# Patient Record
Sex: Male | Born: 1935 | Race: White | Hispanic: No | Marital: Married | Smoking: Never smoker
Health system: Southern US, Community
[De-identification: ages and names within clinical notes are randomized; demographics above are authoritative.]

## PROBLEM LIST (undated history)

## (undated) DIAGNOSIS — F0390 Unspecified dementia without behavioral disturbance: Secondary | ICD-10-CM

## (undated) DIAGNOSIS — I10 Essential (primary) hypertension: Secondary | ICD-10-CM

## (undated) DIAGNOSIS — H9190 Unspecified hearing loss, unspecified ear: Secondary | ICD-10-CM

## (undated) DIAGNOSIS — R569 Unspecified convulsions: Secondary | ICD-10-CM

## (undated) DIAGNOSIS — I495 Sick sinus syndrome: Secondary | ICD-10-CM

## (undated) DIAGNOSIS — I639 Cerebral infarction, unspecified: Secondary | ICD-10-CM

## (undated) DIAGNOSIS — IMO0001 Reserved for inherently not codable concepts without codable children: Secondary | ICD-10-CM

## (undated) DIAGNOSIS — D649 Anemia, unspecified: Secondary | ICD-10-CM

## (undated) DIAGNOSIS — E119 Type 2 diabetes mellitus without complications: Secondary | ICD-10-CM

## (undated) DIAGNOSIS — E785 Hyperlipidemia, unspecified: Secondary | ICD-10-CM

## (undated) HISTORY — DX: Anemia, unspecified: D64.9

## (undated) HISTORY — PX: CARDIAC CATHETERIZATION: SHX172

## (undated) HISTORY — PX: CHOLECYSTECTOMY: SHX55

## (undated) HISTORY — PX: BACK SURGERY: SHX140

## (undated) HISTORY — DX: Type 2 diabetes mellitus without complications: E11.9

## (undated) HISTORY — DX: Unspecified convulsions: R56.9

## (undated) HISTORY — DX: Unspecified dementia, unspecified severity, without behavioral disturbance, psychotic disturbance, mood disturbance, and anxiety: F03.90

## (undated) HISTORY — DX: Hyperlipidemia, unspecified: E78.5

## (undated) HISTORY — PX: ROTATOR CUFF REPAIR: SHX139

## (undated) HISTORY — DX: Essential (primary) hypertension: I10

## (undated) HISTORY — DX: Sick sinus syndrome: I49.5

## (undated) HISTORY — PX: CORONARY ARTERY BYPASS GRAFT: SHX141

## (undated) HISTORY — PX: CATARACT EXTRACTION: SUR2

## (undated) HISTORY — PX: COLONOSCOPY: SHX174

---

## 1997-05-14 ENCOUNTER — Inpatient Hospital Stay (HOSPITAL_COMMUNITY): Admission: RE | Admit: 1997-05-14 | Discharge: 1997-05-17 | Payer: Self-pay | Admitting: Cardiology

## 1999-05-22 ENCOUNTER — Inpatient Hospital Stay (HOSPITAL_COMMUNITY): Admission: RE | Admit: 1999-05-22 | Discharge: 1999-05-30 | Payer: Self-pay | Admitting: Family Medicine

## 1999-05-23 ENCOUNTER — Encounter: Payer: Self-pay | Admitting: Cardiothoracic Surgery

## 1999-05-25 ENCOUNTER — Encounter: Payer: Self-pay | Admitting: Cardiothoracic Surgery

## 1999-05-26 ENCOUNTER — Encounter: Payer: Self-pay | Admitting: Cardiothoracic Surgery

## 1999-05-27 ENCOUNTER — Encounter: Payer: Self-pay | Admitting: Cardiothoracic Surgery

## 1999-05-28 ENCOUNTER — Encounter: Payer: Self-pay | Admitting: Cardiothoracic Surgery

## 2012-10-01 DIAGNOSIS — D62 Acute posthemorrhagic anemia: Secondary | ICD-10-CM | POA: Insufficient documentation

## 2012-10-01 DIAGNOSIS — S36892A Contusion of other intra-abdominal organs, initial encounter: Secondary | ICD-10-CM | POA: Insufficient documentation

## 2012-10-02 DIAGNOSIS — IMO0002 Reserved for concepts with insufficient information to code with codable children: Secondary | ICD-10-CM | POA: Insufficient documentation

## 2012-10-02 DIAGNOSIS — R739 Hyperglycemia, unspecified: Secondary | ICD-10-CM | POA: Insufficient documentation

## 2014-06-25 ENCOUNTER — Other Ambulatory Visit: Payer: Self-pay

## 2014-06-25 ENCOUNTER — Ambulatory Visit (INDEPENDENT_AMBULATORY_CARE_PROVIDER_SITE_OTHER): Payer: Medicare Other | Admitting: Vascular Surgery

## 2014-06-25 ENCOUNTER — Encounter: Payer: Self-pay | Admitting: Vascular Surgery

## 2014-06-25 ENCOUNTER — Encounter (INDEPENDENT_AMBULATORY_CARE_PROVIDER_SITE_OTHER): Payer: Self-pay

## 2014-06-25 VITALS — BP 191/68 | HR 65 | Ht 73.0 in | Wt 188.5 lb

## 2014-06-25 DIAGNOSIS — I6521 Occlusion and stenosis of right carotid artery: Secondary | ICD-10-CM

## 2014-06-25 NOTE — Progress Notes (Signed)
Subjective:     Patient ID: Gary Kirby, male   DOB: 12-05-1933, 81 y.o.   MRN: [id number]  HPI this 81 year old male was referred by Dr. [name] for evaluation of severe right carotid stenosis. Patient has a remote history of CVA in 2014. He has not had recent symptoms other than occasional changes in the visual field of his right eye which are nonspecific. He has previously had ultrasounds [facility]. He recently had a CT angiogram which revealed a severe right ICA stenosis and a mild to moderate left ICA stenosis and he was referred for further evaluation. He does have a history of coronary artery bypass grafting in 2001 by Dr. Kathlee NationsPeter Van Trigt. He was seen recently by his cardiologist Dr.Revankar who cleared the patient for lumbar spine surgery. The CT angiogram was then performed which revealed the severe carotid stenosis and he was referred here for further evaluation.  Past Medical History  Diagnosis Date  . Anemia   . Diabetes mellitus without complication   . Hypertension   . Hyperlipidemia     History  Substance Use Topics  . Smoking status: Never Smoker   . Smokeless tobacco: Current User    Types: Snuff  . Alcohol Use: No    Family History  Problem Relation Age of Onset  . Heart disease Mother   . Heart attack Father   . Cancer Brother   . Diabetes Brother   . Heart disease Brother   . Hyperlipidemia Brother     No Known Allergies   Current outpatient prescriptions:  .  Acetaminophen (ARTHRITIS PAIN RELIEVER PO), Take 360 mg by mouth as needed., Disp: , Rfl:  .  amLODipine (NORVASC) 5 MG tablet, Take 5 mg by mouth daily., Disp: , Rfl: 12 .  aspirin 325 MG tablet, Take 325 mg by mouth daily., Disp: , Rfl:  .  citalopram (CELEXA) 20 MG tablet, Take 1 tablet by mouth daily., Disp: , Rfl:  .  hydrochlorothiazide (HYDRODIURIL) 12.5 MG tablet, Take 12.5 mg by mouth daily., Disp: , Rfl:  .  isosorbide mononitrate (IMDUR) 60 MG 24 hr tablet, Take 60 mg by  mouth daily., Disp: , Rfl: 3 .  meclizine (ANTIVERT) 25 MG tablet, Take 25 mg by mouth 3 (three) times daily as needed. , Disp: , Rfl: 3 .  metFORMIN (GLUCOPHAGE) 1000 MG tablet, Take 1 tablet by mouth 2 (two) times daily., Disp: , Rfl:  .  pramipexole (MIRAPEX) 0.5 MG tablet, Take 0.5 mg by mouth daily as needed. , Disp: , Rfl: 12 .  quinapril (ACCUPRIL) 40 MG tablet, Take 40 mg by mouth daily., Disp: , Rfl:   Filed Vitals:   06/25/14 0836 06/25/14 0837  BP: 196/66 191/68  Pulse: 65   Height: 6\' 1"  (1.854 m)   Weight: 188 lb 8 oz (85.503 kg)   SpO2: 97%     Body mass index is 24.87 kg/(m^2).         tReview of Systems denies any active chest pain or dyspnea on exertion.   has occasional pain in legs with walking and while lying flat. Occasionally complains of numbness in the arms or legs and dizziness. Had lumbar spine problems and was tentatively scheduled for surgery which now apparently has improved. Other systems negative and complete review of systems Objective:   Physical Exam BP 191/68 mmHg  Pulse 65  Ht 6\' 1"  (1.854 m)  Wt 188 lb 8 oz (85.503 kg)  BMI 24.87 kg/m2  SpO2 97%  Gen.-alert and oriented x3 in no apparent distress HEENT normal for age Lungs no rhonchi or wheezing Cardiovascular regular rhythm no murmurs carotid pulses 3+ palpable--soft bruits bilaterally over carotid bifurcations Abdomen soft nontender no palpable masses Musculoskeletal free of  major deformities Skin clear -no rashes Neurologic normal Lower extremities 3+ femoral and 2+ popliteal pulses palpable bilaterally. Right leg with 2+ PT left leg with 1+ PT pulse palpable  Today I reviewed the records supplied by Dr. [name]. Also reviewed the report of the CT angiogram of the neck which revealed a greater than 80% right ICA stenosis and mild left ICA stenosis. I also reviewed the images of the CT angiogram by computer. It appears that the right ICA stenosis approximates 90% and the left ICA  stenosis is mild. Patient also has evidence of note right occipital lobe infarct and laminar necrosis with no evidence of hemorrhage.  I also had a phone discussion with Dr. [name] regarding this patient and his general medical condition and also consulted DR Revankar regarding his cardiac status. His most recent evaluation revealed a normal ejection fraction with no evidence of ischemia.      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 Risks and benefits thoroughly discussed with patient and he would like to proceed  45 minutes time spent in perioperative counseling of patient  and coordination of care and discussion of situation with referring doctors Dr. Samuel GermanyGage and Dr.Revankar

## 2014-06-27 ENCOUNTER — Encounter (HOSPITAL_COMMUNITY): Payer: Self-pay

## 2014-06-27 ENCOUNTER — Encounter (HOSPITAL_COMMUNITY)
Admission: RE | Admit: 2014-06-27 | Discharge: 2014-06-27 | Disposition: A | Discharge status: Home / Self Care | Payer: Medicare Other | Source: Ambulatory Visit | Attending: Vascular Surgery | Admitting: Vascular Surgery

## 2014-06-27 HISTORY — DX: Cerebral infarction, unspecified: I63.9

## 2014-06-27 HISTORY — DX: Reserved for inherently not codable concepts without codable children: IMO0001

## 2014-06-27 HISTORY — DX: Unspecified hearing loss, unspecified ear: H91.90

## 2014-06-27 LAB — PROTIME-INR
INR: 1.04 (ref 0.00–1.49)
Prothrombin Time: 13.8 seconds (ref 11.6–15.2)

## 2014-06-27 LAB — URINALYSIS, ROUTINE W REFLEX MICROSCOPIC
Bilirubin Urine: NEGATIVE
Glucose, UA: >1000 mg/dL — AB
Hgb urine dipstick: NEGATIVE
KETONES UR: NEGATIVE mg/dL
LEUKOCYTES UA: NEGATIVE
NITRITE: NEGATIVE
PROTEIN: NEGATIVE mg/dL
Specific Gravity, Urine: 1.018 (ref 1.005–1.030)
Urobilinogen, UA: 0.2 mg/dL (ref 0.0–1.0)
pH: 5 (ref 5.0–8.0)

## 2014-06-27 LAB — CBC
HEMATOCRIT: 34.7 % — AB (ref 39.0–52.0)
HEMOGLOBIN: 11.1 g/dL — AB (ref 13.0–17.0)
MCH: 30.7 pg (ref 26.0–34.0)
MCHC: 32 g/dL (ref 30.0–36.0)
MCV: 96.1 fL (ref 78.0–100.0)
Platelets: 206 10*3/uL (ref 150–400)
RBC: 3.61 MIL/uL — ABNORMAL LOW (ref 4.22–5.81)
RDW: 13.9 % (ref 11.5–15.5)
WBC: 8.8 10*3/uL (ref 4.0–10.5)

## 2014-06-27 LAB — COMPREHENSIVE METABOLIC PANEL
ALT: 12 U/L — ABNORMAL LOW (ref 17–63)
AST: 18 U/L (ref 15–41)
Albumin: 3.6 g/dL (ref 3.5–5.0)
Alkaline Phosphatase: 48 U/L (ref 38–126)
Anion gap: 9 (ref 5–15)
BUN: 25 mg/dL — ABNORMAL HIGH (ref 6–20)
CHLORIDE: 103 mmol/L (ref 101–111)
CO2: 26 mmol/L (ref 22–32)
Calcium: 9.2 mg/dL (ref 8.9–10.3)
Creatinine, Ser: 1.21 mg/dL (ref 0.61–1.24)
GFR calc non Af Amer: 56 mL/min — ABNORMAL LOW (ref >60)
Glucose, Bld: 228 mg/dL — ABNORMAL HIGH (ref 65–99)
Potassium: 4.5 mmol/L (ref 3.5–5.1)
SODIUM: 138 mmol/L (ref 135–145)
TOTAL PROTEIN: 6.4 g/dL — AB (ref 6.5–8.1)
Total Bilirubin: 0.4 mg/dL (ref 0.3–1.2)

## 2014-06-27 LAB — SURGICAL PCR SCREEN
MRSA, PCR: NEGATIVE
STAPHYLOCOCCUS AUREUS: NEGATIVE

## 2014-06-27 LAB — PREPARE RBC (CROSSMATCH)

## 2014-06-27 LAB — URINE MICROSCOPIC-ADD ON

## 2014-06-27 LAB — GLUCOSE, CAPILLARY: Glucose-Capillary: 232 mg/dL — ABNORMAL HIGH (ref 65–99)

## 2014-06-27 LAB — ABO/RH: ABO/RH(D): O POS

## 2014-06-27 LAB — APTT: APTT: 34 s (ref 24–37)

## 2014-06-27 NOTE — Pre-Procedure Instructions (Signed)
Gary SavoyBilly J Kirby  06/27/2014   Your procedure is scheduled on:  Friday Jun 28, 2014 at 7:30 AM.  Report to [facility] at 5:30 AM.  Call this number if you have problems the morning of surgery: [phone]   Remember:   Do not eat food or drink liquids after midnight.   Take these medicines the morning of surgery with A SIP OF WATER: Acetaminophen (Tylenol) if needed, Amlodipine (Norvasc), Citalopram (Celexa), Hydrocodone if needed, Isosorbide (Imdur), Meclizine (Antivert) if needed   Do NOT take any diabetic medications the morning of your surgery (Ex. Metformin/Glocophage or Januvia)   Do not wear jewelry.  Do not wear lotions, powders, or cologne.   Men may shave face and neck.  Do not bring valuables to the hospital.  [facility] is not responsible for any belongings or valuables.               Contacts, dentures or bridgework may not be worn into surgery.  Leave suitcase in the car. After surgery it may be brought to your room.  For patients admitted to the hospital, discharge time is determined by your treatment team.               Patients discharged the day of surgery will not be allowed to drive home.  Name and phone number of your driver:   Special Instructions: Shower using CHG soap the night before and the morning of your surgery   Please read over the following fact sheets that you were given: Pain Booklet, Coughing and Deep Breathing, Blood Transfusion Information, MRSA Information and Surgical Site Infection Prevention

## 2014-06-27 NOTE — Progress Notes (Signed)
Anesthesia Chart Review:  Patient is a 81 year old male scheduled for right CEA on 06/28/14 by Dr. Hart RochesterLawson. PAT was 06/27/14.  History includes non-smoker, CAD s/p CABG '01, DM2, CVA '14, exertional dyspnea, HTN, anemia, CVA, cholecytectomy.  PCP is Dr. [name].  Cardiologist is with [facility] [place].  Dr. Hart RochesterLawson has discussed plans for surgery with Dr. Samuel GermanyGage and Dr. Josiah Loboevenkar. There is a note of cardiac clearance scanned under the Media tab.   Meds include amlodipine, ASA, Celexa, Folvite, HCTZ, Norco, Imdur, Januvia, metformin, Mirapex, pravastatin, quinapril. He is not on b-blocker therapy because of relative bradycardia.  02/20/14 EKG: NSR, RBBB.  Cardiology notes indicate patient had a recent non-ischemic stress test.  Dr. Crisoforo Oxfordevenkar's office said stress test results would have to be obtained from central [facility] MR department since they were no longer associated with [facility].  Report from 03/14/14 received and showed: functional capacity is only fair for age - 4 METS; small hyperdynamic LV, measured EF 80%; mild diaphragm attenuation artifact.; no clinical or scintigraphic ischemia at target HR. NEGATIVE TREADMILL MYOCARDIAL PERFUSION STRESS TEST.   03/08/08 Cardiac cath (HPR): Patent LIMA to the mid LAD. Patent SVG to the mid CX. Mild disease of SVG to the mid DIAG1. Hyperdynamic LV systolic function, EF 85%. LM diffuse 80%, LAD 80% ostial, LCX ok, RCA proximal 40%. Medical therapy for CAD risk factor reduction.   According to Dr. Candie ChromanLawson's note, recentt imaging showed: "CT angiogram of the neck which revealed a greater than 80% right ICA stenosis and mild left ICA stenosis. I also reviewed the images of the CT angiogram by computer. It appears that the right ICA stenosis approximates 90% and the left ICA stenosis is mild. Patient also has evidence of note right occipital lobe infarct and laminar necrosis with no evidence of hemorrhage".  Preoperative labs  noted.  Patient has cardiac clearance. I think he will be able to proceed as planned if no acute changes.   Velna Ochsllison Mariateresa Batra, PA-C [facility]iology Phone [phone] 06/27/2014 5:38 PM

## 2014-06-27 NOTE — Progress Notes (Signed)
Patient arrived to scheduled 1200 PAT at 1050. Patients CBG was 232. Patient stated he ate one and a half bowls of cereal and used Equal sugar. When asked about fasting glucose patient informed Nurse that he does not check his blood sugars in the morning, only at night. Patient stated the highest he has seen his blood sugar was 230 and the lowest was 100. Nurse questioned patient about last hemoglobin A1C and patient stated "what is that? I don't know what that is?" Nurse then asked patient who managed his diabetes and he stated "I do!." Then Nurse reworded the question and asked him which physician managed his diabetes and he stated [name].   Nurse instructed patient to check his blood sugar DOS and IF it was below 70 to drink a half a cup of apple or cranberry juice as instructed on diabetes handout. Patient verbalized understanding.

## 2014-06-27 NOTE — Progress Notes (Signed)
PCP is Renae FickleJohn Kirby. Patient stated he had a Cardiologist in [place] but was unsure of who it is.Marland Kitchen.Marland Kitchen.Marland Kitchen.Marland Kitchen.patient stated "he has a funny name." Cardiologist may be Gypsy Balsamobert Krasowski. Patient also informed Nurse that he had a stress test within the past few weeks. Will request results. Patient denied having any acute cardiac or pulmonary issues. Nurse questioned patient as to why he needed to have a stress test and patient stated "I don't know.....sometimes I feel funny.Marland Kitchen.Marland Kitchen.Marland Kitchen.I can't explain it other than I feel funny, but the doctor told me that everything was alright." Patient denied having a cardiac cath but apparently patient had one before having a CABG. Nurse called Revonda StandardAllison, PA and informed her patients cardiac history.

## 2014-06-28 ENCOUNTER — Encounter (HOSPITAL_COMMUNITY)
Admission: RE | Disposition: A | Discharge status: Home / Self Care | Payer: Self-pay | Source: Ambulatory Visit | Attending: Vascular Surgery

## 2014-06-28 ENCOUNTER — Inpatient Hospital Stay (HOSPITAL_COMMUNITY): Payer: Medicare Other | Admitting: Vascular Surgery

## 2014-06-28 ENCOUNTER — Inpatient Hospital Stay (HOSPITAL_COMMUNITY)
Admission: RE | Admit: 2014-06-28 | Discharge: 2014-06-29 | DRG: 039 | Disposition: A | Discharge status: Home / Self Care | Payer: Medicare Other | Source: Ambulatory Visit | Attending: Vascular Surgery | Admitting: Vascular Surgery

## 2014-06-28 ENCOUNTER — Encounter (HOSPITAL_COMMUNITY): Payer: Self-pay | Admitting: Anesthesiology

## 2014-06-28 ENCOUNTER — Inpatient Hospital Stay (HOSPITAL_COMMUNITY): Payer: Medicare Other | Admitting: Anesthesiology

## 2014-06-28 DIAGNOSIS — E119 Type 2 diabetes mellitus without complications: Secondary | ICD-10-CM | POA: Diagnosis present

## 2014-06-28 DIAGNOSIS — I10 Essential (primary) hypertension: Secondary | ICD-10-CM | POA: Diagnosis present

## 2014-06-28 DIAGNOSIS — Z7982 Long term (current) use of aspirin: Secondary | ICD-10-CM | POA: Diagnosis not present

## 2014-06-28 DIAGNOSIS — E785 Hyperlipidemia, unspecified: Secondary | ICD-10-CM | POA: Diagnosis present

## 2014-06-28 DIAGNOSIS — Z8673 Personal history of transient ischemic attack (TIA), and cerebral infarction without residual deficits: Secondary | ICD-10-CM

## 2014-06-28 DIAGNOSIS — H9190 Unspecified hearing loss, unspecified ear: Secondary | ICD-10-CM | POA: Diagnosis present

## 2014-06-28 DIAGNOSIS — Z951 Presence of aortocoronary bypass graft: Secondary | ICD-10-CM

## 2014-06-28 DIAGNOSIS — I2510 Atherosclerotic heart disease of native coronary artery without angina pectoris: Secondary | ICD-10-CM | POA: Diagnosis present

## 2014-06-28 DIAGNOSIS — I6521 Occlusion and stenosis of right carotid artery: Principal | ICD-10-CM | POA: Diagnosis present

## 2014-06-28 HISTORY — PX: PATCH ANGIOPLASTY: SHX6230

## 2014-06-28 HISTORY — PX: ENDARTERECTOMY: SHX5162

## 2014-06-28 LAB — CBC
HEMATOCRIT: 28.5 % — AB (ref 39.0–52.0)
HEMOGLOBIN: 9 g/dL — AB (ref 13.0–17.0)
MCH: 30.3 pg (ref 26.0–34.0)
MCHC: 31.6 g/dL (ref 30.0–36.0)
MCV: 96 fL (ref 78.0–100.0)
PLATELETS: 174 10*3/uL (ref 150–400)
RBC: 2.97 MIL/uL — AB (ref 4.22–5.81)
RDW: 13.9 % (ref 11.5–15.5)
WBC: 9.9 10*3/uL (ref 4.0–10.5)

## 2014-06-28 LAB — GLUCOSE, CAPILLARY
Glucose-Capillary: 141 mg/dL — ABNORMAL HIGH (ref 65–99)
Glucose-Capillary: 147 mg/dL — ABNORMAL HIGH (ref 65–99)
Glucose-Capillary: 128 mg/dL — ABNORMAL HIGH (ref 65–99)
Glucose-Capillary: 170 mg/dL — ABNORMAL HIGH (ref 65–99)

## 2014-06-28 LAB — CREATININE, SERUM: CREATININE: 1.02 mg/dL (ref 0.61–1.24)

## 2014-06-28 LAB — HEMOGLOBIN A1C
HEMOGLOBIN A1C: 7.7 % — AB (ref 4.8–5.6)
Mean Plasma Glucose: 174 mg/dL

## 2014-06-28 SURGERY — ENDARTERECTOMY, CAROTID
Anesthesia: General | Site: Neck | Laterality: Right

## 2014-06-28 MED ORDER — FENTANYL CITRATE (PF) 100 MCG/2ML IJ SOLN
INTRAMUSCULAR | Status: DC | PRN
  Administered 2014-06-28: 50 ug via INTRAVENOUS
  Administered 2014-06-28: 100 ug via INTRAVENOUS
  Administered 2014-06-28: 50 ug via INTRAVENOUS

## 2014-06-28 MED ORDER — MECLIZINE HCL 25 MG PO TABS
25.0000 mg | ORAL_TABLET | Freq: Three times a day (TID) | ORAL | Status: DC | PRN
  Filled 2014-06-28: qty 1

## 2014-06-28 MED ORDER — GUAIFENESIN-DM 100-10 MG/5ML PO SYRP: 15.0000 mL | ORAL_SOLUTION | ORAL | Status: DC | PRN

## 2014-06-28 MED ORDER — ISOSORBIDE MONONITRATE ER 60 MG PO TB24
60.0000 mg | ORAL_TABLET | Freq: Every day | ORAL | Status: DC
  Administered 2014-06-29: 60 mg via ORAL
  Filled 2014-06-28: qty 1

## 2014-06-28 MED ORDER — PROPOFOL 10 MG/ML IV BOLUS
INTRAVENOUS | Status: AC
  Filled 2014-06-28: qty 20

## 2014-06-28 MED ORDER — SODIUM CHLORIDE 0.9 % IV SOLN
10.0000 mg | INTRAVENOUS | Status: DC | PRN
  Administered 2014-06-28: 50 ug/min via INTRAVENOUS

## 2014-06-28 MED ORDER — ASPIRIN EC 325 MG PO TBEC
325.0000 mg | DELAYED_RELEASE_TABLET | Freq: Every day | ORAL | Status: DC
  Administered 2014-06-29: 325 mg via ORAL
  Filled 2014-06-28: qty 1

## 2014-06-28 MED ORDER — PRAMIPEXOLE DIHYDROCHLORIDE 0.25 MG PO TABS
0.5000 mg | ORAL_TABLET | Freq: Every day | ORAL | Status: DC | PRN
  Filled 2014-06-28: qty 2

## 2014-06-28 MED ORDER — METFORMIN HCL 500 MG PO TABS
1000.0000 mg | ORAL_TABLET | Freq: Two times a day (BID) | ORAL | Status: DC
  Administered 2014-06-28 – 2014-06-29 (×2): 1000 mg via ORAL
  Filled 2014-06-28 (×4): qty 2

## 2014-06-28 MED ORDER — DEXTROSE 5 % IV SOLN
1.5000 g | INTRAVENOUS | Status: AC
  Administered 2014-06-28: 1.5 g via INTRAVENOUS

## 2014-06-28 MED ORDER — FENTANYL CITRATE (PF) 250 MCG/5ML IJ SOLN
INTRAMUSCULAR | Status: AC
  Filled 2014-06-28: qty 5

## 2014-06-28 MED ORDER — SENNOSIDES-DOCUSATE SODIUM 8.6-50 MG PO TABS
1.0000 | ORAL_TABLET | Freq: Every evening | ORAL | Status: DC | PRN
  Filled 2014-06-28: qty 1

## 2014-06-28 MED ORDER — HYDROMORPHONE HCL 1 MG/ML IJ SOLN
0.2500 mg | INTRAMUSCULAR | Status: DC | PRN
  Administered 2014-06-28 (×4): 0.5 mg via INTRAVENOUS

## 2014-06-28 MED ORDER — ROCURONIUM BROMIDE 100 MG/10ML IV SOLN
INTRAVENOUS | Status: DC | PRN
  Administered 2014-06-28: 40 mg via INTRAVENOUS

## 2014-06-28 MED ORDER — CHLORHEXIDINE GLUCONATE CLOTH 2 % EX PADS: 6.0000 | MEDICATED_PAD | Freq: Once | CUTANEOUS | Status: DC

## 2014-06-28 MED ORDER — ROCURONIUM BROMIDE 50 MG/5ML IV SOLN
INTRAVENOUS | Status: AC
Start: 2014-06-28 — End: 2014-06-28
  Filled 2014-06-28: qty 1

## 2014-06-28 MED ORDER — PROTAMINE SULFATE 10 MG/ML IV SOLN
INTRAVENOUS | Status: DC | PRN
  Administered 2014-06-28 (×2): 20 mg via INTRAVENOUS
  Administered 2014-06-28: 10 mg via INTRAVENOUS

## 2014-06-28 MED ORDER — POTASSIUM CHLORIDE CRYS ER 20 MEQ PO TBCR: 20.0000 meq | EXTENDED_RELEASE_TABLET | Freq: Every day | ORAL | Status: DC | PRN

## 2014-06-28 MED ORDER — SODIUM CHLORIDE 0.9 % IV SOLN
INTRAVENOUS | Status: DC
  Administered 2014-06-28: 125 mL/h via INTRAVENOUS

## 2014-06-28 MED ORDER — LIDOCAINE HCL (PF) 1 % IJ SOLN
INTRAMUSCULAR | Status: AC
  Filled 2014-06-28: qty 30

## 2014-06-28 MED ORDER — PRAVASTATIN SODIUM 80 MG PO TABS
80.0000 mg | ORAL_TABLET | Freq: Every day | ORAL | Status: DC
  Administered 2014-06-28 – 2014-06-29 (×2): 80 mg via ORAL
  Filled 2014-06-28 (×2): qty 1

## 2014-06-28 MED ORDER — ACETAMINOPHEN 325 MG RE SUPP
325.0000 mg | RECTAL | Status: DC | PRN
  Filled 2014-06-28: qty 2

## 2014-06-28 MED ORDER — EPHEDRINE SULFATE 50 MG/ML IJ SOLN
INTRAMUSCULAR | Status: DC | PRN
  Administered 2014-06-28: 10 mg via INTRAVENOUS

## 2014-06-28 MED ORDER — GLYCOPYRROLATE 0.2 MG/ML IJ SOLN
INTRAMUSCULAR | Status: AC
  Filled 2014-06-28: qty 2

## 2014-06-28 MED ORDER — PHENOL 1.4 % MT LIQD: 1.0000 | OROMUCOSAL | Status: DC | PRN

## 2014-06-28 MED ORDER — LABETALOL HCL 5 MG/ML IV SOLN: 10.0000 mg | INTRAVENOUS | Status: DC | PRN

## 2014-06-28 MED ORDER — SODIUM CHLORIDE 0.9 % IV SOLN: INTRAVENOUS | Status: DC

## 2014-06-28 MED ORDER — BISACODYL 10 MG RE SUPP: 10.0000 mg | Freq: Every day | RECTAL | Status: DC | PRN

## 2014-06-28 MED ORDER — LIDOCAINE HCL (CARDIAC) 20 MG/ML IV SOLN
INTRAVENOUS | Status: DC | PRN
  Administered 2014-06-28: 40 mg via INTRAVENOUS

## 2014-06-28 MED ORDER — LINAGLIPTIN 5 MG PO TABS
5.0000 mg | ORAL_TABLET | Freq: Every day | ORAL | Status: DC
  Administered 2014-06-28 – 2014-06-29 (×2): 5 mg via ORAL
  Filled 2014-06-28 (×2): qty 1

## 2014-06-28 MED ORDER — ALUM & MAG HYDROXIDE-SIMETH 200-200-20 MG/5ML PO SUSP: 15.0000 mL | ORAL | Status: DC | PRN

## 2014-06-28 MED ORDER — OXYCODONE-ACETAMINOPHEN 5-325 MG PO TABS
1.0000 | ORAL_TABLET | ORAL | Status: DC | PRN
  Administered 2014-06-28: 1 via ORAL
  Filled 2014-06-28: qty 1

## 2014-06-28 MED ORDER — LISINOPRIL 40 MG PO TABS
40.0000 mg | ORAL_TABLET | Freq: Every day | ORAL | Status: DC
  Administered 2014-06-29: 40 mg via ORAL
  Filled 2014-06-28: qty 1

## 2014-06-28 MED ORDER — HYDRALAZINE HCL 20 MG/ML IJ SOLN: 5.0000 mg | INTRAMUSCULAR | Status: DC | PRN

## 2014-06-28 MED ORDER — SODIUM CHLORIDE 0.9 % IJ SOLN
INTRAMUSCULAR | Status: AC
Start: 2014-06-28 — End: 2014-06-28
  Filled 2014-06-28: qty 10

## 2014-06-28 MED ORDER — PROMETHAZINE HCL 25 MG/ML IJ SOLN: 6.2500 mg | INTRAMUSCULAR | Status: DC | PRN

## 2014-06-28 MED ORDER — SODIUM CHLORIDE 0.9 % IR SOLN
Status: DC | PRN
  Administered 2014-06-28: 500 mL

## 2014-06-28 MED ORDER — MORPHINE SULFATE 2 MG/ML IJ SOLN: 2.0000 mg | INTRAMUSCULAR | Status: DC | PRN

## 2014-06-28 MED ORDER — MAGNESIUM SULFATE 2 GM/50ML IV SOLN: 2.0000 g | Freq: Every day | INTRAVENOUS | Status: DC | PRN

## 2014-06-28 MED ORDER — INSULIN ASPART 100 UNIT/ML ~~LOC~~ SOLN
0.0000 [IU] | Freq: Three times a day (TID) | SUBCUTANEOUS | Status: DC
  Administered 2014-06-29: 3 [IU] via SUBCUTANEOUS

## 2014-06-28 MED ORDER — HEPARIN SODIUM (PORCINE) 1000 UNIT/ML IJ SOLN
INTRAMUSCULAR | Status: DC | PRN
  Administered 2014-06-28: 6000 [IU] via INTRAVENOUS

## 2014-06-28 MED ORDER — CITALOPRAM HYDROBROMIDE 20 MG PO TABS
20.0000 mg | ORAL_TABLET | Freq: Every day | ORAL | Status: DC
Start: 2014-06-29 — End: 2014-06-29
  Administered 2014-06-29: 20 mg via ORAL
  Filled 2014-06-28: qty 1

## 2014-06-28 MED ORDER — ONDANSETRON HCL 4 MG/2ML IJ SOLN: 4.0000 mg | Freq: Four times a day (QID) | INTRAMUSCULAR | Status: DC | PRN

## 2014-06-28 MED ORDER — ONDANSETRON HCL 4 MG/2ML IJ SOLN
INTRAMUSCULAR | Status: DC | PRN
  Administered 2014-06-28: 4 mg via INTRAVENOUS

## 2014-06-28 MED ORDER — DOCUSATE SODIUM 100 MG PO CAPS
100.0000 mg | ORAL_CAPSULE | Freq: Every day | ORAL | Status: DC
  Administered 2014-06-29: 100 mg via ORAL
  Filled 2014-06-28: qty 1

## 2014-06-28 MED ORDER — HYDROCHLOROTHIAZIDE 25 MG PO TABS
12.5000 mg | ORAL_TABLET | Freq: Every day | ORAL | Status: DC
  Filled 2014-06-28: qty 0.5

## 2014-06-28 MED ORDER — 0.9 % SODIUM CHLORIDE (POUR BTL) OPTIME
TOPICAL | Status: DC | PRN
  Administered 2014-06-28: 2000 mL

## 2014-06-28 MED ORDER — HYDROCHLOROTHIAZIDE 12.5 MG PO CAPS
12.5000 mg | ORAL_CAPSULE | Freq: Every day | ORAL | Status: DC
  Administered 2014-06-29: 12.5 mg via ORAL
  Filled 2014-06-28 (×2): qty 1

## 2014-06-28 MED ORDER — HYDROMORPHONE HCL 1 MG/ML IJ SOLN
INTRAMUSCULAR | Status: AC
  Administered 2014-06-28: 0.5 mg via INTRAVENOUS
  Filled 2014-06-28: qty 1

## 2014-06-28 MED ORDER — ENOXAPARIN SODIUM 30 MG/0.3ML ~~LOC~~ SOLN
30.0000 mg | SUBCUTANEOUS | Status: DC
  Administered 2014-06-29: 30 mg via SUBCUTANEOUS
  Filled 2014-06-28 (×2): qty 0.3

## 2014-06-28 MED ORDER — NEOSTIGMINE METHYLSULFATE 10 MG/10ML IV SOLN
INTRAVENOUS | Status: DC | PRN
  Administered 2014-06-28: 3 mg via INTRAVENOUS

## 2014-06-28 MED ORDER — AMLODIPINE BESYLATE 5 MG PO TABS
5.0000 mg | ORAL_TABLET | Freq: Every day | ORAL | Status: DC
Start: 2014-06-29 — End: 2014-06-29
  Administered 2014-06-29: 5 mg via ORAL
  Filled 2014-06-28: qty 1

## 2014-06-28 MED ORDER — GLYCOPYRROLATE 0.2 MG/ML IJ SOLN
INTRAMUSCULAR | Status: DC | PRN
  Administered 2014-06-28: 0.4 mg via INTRAVENOUS

## 2014-06-28 MED ORDER — DEXTROSE 5 % IV SOLN
INTRAVENOUS | Status: AC
  Filled 2014-06-28: qty 1.5

## 2014-06-28 MED ORDER — ACETAMINOPHEN 325 MG PO TABS
325.0000 mg | ORAL_TABLET | ORAL | Status: DC | PRN
  Administered 2014-06-28 – 2014-06-29 (×2): 650 mg via ORAL
  Filled 2014-06-28: qty 2

## 2014-06-28 MED ORDER — HYDROCODONE-ACETAMINOPHEN 7.5-325 MG PO TABS
1.0000 | ORAL_TABLET | ORAL | Status: DC | PRN
  Administered 2014-06-28: 2 via ORAL
  Filled 2014-06-28: qty 2

## 2014-06-28 MED ORDER — HEPARIN SODIUM (PORCINE) 1000 UNIT/ML IJ SOLN
INTRAMUSCULAR | Status: AC
  Filled 2014-06-28: qty 1

## 2014-06-28 MED ORDER — LIDOCAINE HCL (CARDIAC) 20 MG/ML IV SOLN
INTRAVENOUS | Status: AC
  Filled 2014-06-28: qty 5

## 2014-06-28 MED ORDER — SODIUM CHLORIDE 0.9 % IV SOLN: 500.0000 mL | Freq: Once | INTRAVENOUS | Status: AC | PRN

## 2014-06-28 MED ORDER — METOPROLOL TARTRATE 1 MG/ML IV SOLN: 2.0000 mg | INTRAVENOUS | Status: DC | PRN

## 2014-06-28 MED ORDER — ONDANSETRON HCL 4 MG/2ML IJ SOLN
INTRAMUSCULAR | Status: AC
  Filled 2014-06-28: qty 2

## 2014-06-28 MED ORDER — ACETAMINOPHEN 325 MG PO TABS
ORAL_TABLET | ORAL | Status: AC
  Filled 2014-06-28: qty 2

## 2014-06-28 MED ORDER — PROPOFOL 10 MG/ML IV BOLUS
INTRAVENOUS | Status: DC | PRN
  Administered 2014-06-28: 150 mg via INTRAVENOUS

## 2014-06-28 MED ORDER — PANTOPRAZOLE SODIUM 40 MG PO TBEC
40.0000 mg | DELAYED_RELEASE_TABLET | Freq: Every day | ORAL | Status: DC
  Administered 2014-06-28 – 2014-06-29 (×2): 40 mg via ORAL
  Filled 2014-06-28: qty 1

## 2014-06-28 MED ORDER — PROTAMINE SULFATE 10 MG/ML IV SOLN
INTRAVENOUS | Status: AC
  Filled 2014-06-28: qty 5

## 2014-06-28 MED ORDER — CEFUROXIME SODIUM 1.5 G IJ SOLR
1.5000 g | Freq: Two times a day (BID) | INTRAMUSCULAR | Status: DC
  Administered 2014-06-28: 1.5 g via INTRAVENOUS
  Filled 2014-06-28 (×2): qty 1.5

## 2014-06-28 MED ORDER — LACTATED RINGERS IV SOLN
INTRAVENOUS | Status: DC | PRN
  Administered 2014-06-28: 07:00:00 via INTRAVENOUS

## 2014-06-28 MED ORDER — EPHEDRINE SULFATE 50 MG/ML IJ SOLN
INTRAMUSCULAR | Status: AC
  Filled 2014-06-28: qty 1

## 2014-06-28 MED ORDER — QUINAPRIL HCL 10 MG PO TABS: 40.0000 mg | ORAL_TABLET | Freq: Every day | ORAL | Status: DC

## 2014-06-28 MED ORDER — NEOSTIGMINE METHYLSULFATE 10 MG/10ML IV SOLN
INTRAVENOUS | Status: AC
  Filled 2014-06-28: qty 1

## 2014-06-28 SURGICAL SUPPLY — 46 items
CANISTER SUCTION 2500CC (MISCELLANEOUS) ×2 IMPLANT
CATH ROBINSON RED A/P 18FR (CATHETERS) ×2 IMPLANT
CATH SUCT 10FR WHISTLE TIP (CATHETERS) ×2 IMPLANT
CLIP TI MEDIUM 24 (CLIP) ×2 IMPLANT
CLIP TI WIDE RED SMALL 24 (CLIP) ×2 IMPLANT
CRADLE DONUT ADULT HEAD (MISCELLANEOUS) ×2 IMPLANT
DECANTER SPIKE VIAL GLASS SM (MISCELLANEOUS) IMPLANT
DRAIN HEMOVAC 1/8 X 5 (WOUND CARE) IMPLANT
DRSG COVADERM 4X8 (GAUZE/BANDAGES/DRESSINGS) ×1 IMPLANT
ELECT REM PT RETURN 9FT ADLT (ELECTROSURGICAL) ×2
ELECTRODE REM PT RTRN 9FT ADLT (ELECTROSURGICAL) ×1 IMPLANT
EVACUATOR SILICONE 100CC (DRAIN) IMPLANT
GAUZE SPONGE 4X4 12PLY STRL (GAUZE/BANDAGES/DRESSINGS) ×2 IMPLANT
GLOVE BIOGEL M 6.5 STRL (GLOVE) ×1 IMPLANT
GLOVE BIOGEL PI IND STRL 6.5 (GLOVE) IMPLANT
GLOVE BIOGEL PI IND STRL 7.0 (GLOVE) IMPLANT
GLOVE BIOGEL PI IND STRL 7.5 (GLOVE) IMPLANT
GLOVE BIOGEL PI INDICATOR 6.5 (GLOVE) ×3
GLOVE BIOGEL PI INDICATOR 7.0 (GLOVE) ×1
GLOVE BIOGEL PI INDICATOR 7.5 (GLOVE) ×1
GLOVE ECLIPSE 7.0 STRL STRAW (GLOVE) ×1 IMPLANT
GLOVE SS BIOGEL STRL SZ 7 (GLOVE) ×1 IMPLANT
GLOVE SUPERSENSE BIOGEL SZ 7 (GLOVE) ×1
GLOVE SURG SS PI 6.5 STRL IVOR (GLOVE) ×1 IMPLANT
GOWN STRL REUS W/ TWL LRG LVL3 (GOWN DISPOSABLE) ×3 IMPLANT
GOWN STRL REUS W/TWL LRG LVL3 (GOWN DISPOSABLE) ×8
INSERT FOGARTY SM (MISCELLANEOUS) ×2 IMPLANT
KIT BASIN OR (CUSTOM PROCEDURE TRAY) ×2 IMPLANT
KIT ROOM TURNOVER OR (KITS) ×2 IMPLANT
NEEDLE 22X1 1/2 (OR ONLY) (NEEDLE) IMPLANT
NS IRRIG 1000ML POUR BTL (IV SOLUTION) ×4 IMPLANT
PACK CAROTID (CUSTOM PROCEDURE TRAY) ×2 IMPLANT
PAD ARMBOARD 7.5X6 YLW CONV (MISCELLANEOUS) ×4 IMPLANT
PATCH HEMASHIELD 8X75 (Vascular Products) ×1 IMPLANT
SHUNT CAROTID BYPASS 12FRX15.5 (VASCULAR PRODUCTS) IMPLANT
SPONGE INTESTINAL PEANUT (DISPOSABLE) ×2 IMPLANT
SUT PROLENE 6 0 C 1 24 (SUTURE) ×1 IMPLANT
SUT PROLENE 6 0 CC (SUTURE) ×3 IMPLANT
SUT SILK 2 0 FS (SUTURE) ×2 IMPLANT
SUT SILK 3 0 TIES 17X18 (SUTURE)
SUT SILK 3-0 18XBRD TIE BLK (SUTURE) IMPLANT
SUT VIC AB 2-0 CT1 27 (SUTURE) ×2
SUT VIC AB 2-0 CT1 TAPERPNT 27 (SUTURE) ×1 IMPLANT
SUT VIC AB 3-0 X1 27 (SUTURE) ×2 IMPLANT
SYR CONTROL 10ML LL (SYRINGE) IMPLANT
WATER STERILE IRR 1000ML POUR (IV SOLUTION) ×2 IMPLANT

## 2014-06-28 NOTE — Care Management Note (Signed)
Case Management Note  Patient Details  Name: Tanna SavoyBilly J Trusty MRN: [id number] Date of Birth: November 22, 1933  Subjective/Objective:                 Pt  from home admitted with carotid stenosis, s/p R carotid  Endarterectomy 06/28/14.      Action/Plan: Return to home when medically stable. CM to f/u with d/c needs.  Expected Discharge Date:                  Expected Discharge Plan:  Home/Self Care  In-House Referral:     Discharge planning Services  CM Consult  Post Acute Care Choice:    Choice offered to:     DME Arranged:    DME Agency:     HH Arranged:    HH Agency:     Status of Service:  In process, will continue to follow  Medicare Important Message Given:    Date Medicare IM Given:    Medicare IM give by:    Date Additional Medicare IM Given:    Additional Medicare Important Message give by:     If discussed at Long Length of Stay Meetings, dates discussed:    Additional Comments:  Epifanio LeschesCole, Zilla Shartzer Hudson, RN 06/28/2014, 2:58 PM

## 2014-06-28 NOTE — Progress Notes (Signed)
UR COMPLETED  

## 2014-06-28 NOTE — Op Note (Signed)
OPERATIVE REPORT  Date of Surgery: 06/28/2014  Surgeon: Josephina GipJames Vaishnav Demartin, MD  Assistant: Lianne CureMaureen Collins PA  Pre-op Diagnosis: Right carotid artery stenosis I65.21 with a history of remote right brain CVA  Post-op Diagnosis: Right carotid artery stenosis I65.21 with a history of remote right brain CVA  Procedure: Procedure(s): RIGHT CAROTID ARTERY ENDARTERECTOMY  WITH DACRON PATCH ANGIOPLASTY  Anesthesia: General  EBL: 200 cc  Complications: None  Procedure Details: The patient was taken to the operating room and placed in the supine position. Following induction of satisfactory general endotracheal anesthesia the right neck was prepped and draped in a routine sterile manner. Incision was made on the anterior border of the sternocleidomastoid muscle and carried down through the subcutaneous tissue and platysma using the Bovie. Care was taken not to injure the hypoglossal nerve.. The common internal and external carotid arteries were dissected free. There was a calcified atherosclerotic plaque at the carotid bifurcation extending up the internal carotid artery. A #10 shunt was then prepared and the patient was heparinized. The carotid vessels were occluded with vascular clamps. A longitudinal opening was made in the common carotid with a 15 blade extended up the internal carotid with the Potts scissors to a point distal to the disease. The plaque was approximately 90 % stenotic in severity. The distal vessel appeared normal. Shunt was inserted without difficulty reestablishing flow in about 2 minutes. A standard endarterectomy was performed with an eversion endarterectomy of the external carotid. The plaque feathered off  the distal internal carotid artery nicely not requiring any tacking sutures. The lumen was thoroughly irrigated with heparinized saline and loose debris all carefully removed. The arterotomy was then closed with a patch using continuous 6-0 Prolene. Prior to completion of the   Closure the  shunt was removed after approximately 30 minutes of shunt time. Flow was then reestablished up the external branch initially followed by the internal branch. Protamine was given to her reverse the heparin.Following adequate hemostasis the wound was irrigated with saline and closed in layers with Vicryl ain a subcuticular fashion. Sterile dressing was applied and the patient taken to the recovery room in stable condition.  Josephina GipJames Craige Patel, MD 06/28/2014 9:46 AM

## 2014-06-28 NOTE — H&P (View-Only) (Signed)
Subjective:     Patient ID: Gary Kirby, male   DOB: 12-05-1933, 81 y.o.   MRN: [id number]  HPI this 81 year old male was referred by Dr. [name] for evaluation of severe right carotid stenosis. Patient has a remote history of CVA in 2014. He has not had recent symptoms other than occasional changes in the visual field of his right eye which are nonspecific. He has previously had ultrasounds performed [facility]. He recently had a CT angiogram which revealed a severe right ICA stenosis and a mild to moderate left ICA stenosis and he was referred for further evaluation. He does have a history of coronary artery bypass grafting in 2001 by Dr. Kathlee NationsPeter Van Trigt. He was seen recently by his cardiologist Dr.Revankar who cleared the patient for lumbar spine surgery. The CT angiogram was then performed which revealed the severe carotid stenosis and he was referred here for further evaluation.  Past Medical History  Diagnosis Date  . Anemia   . Diabetes mellitus without complication   . Hypertension   . Hyperlipidemia     History  Substance Use Topics  . Smoking status: Never Smoker   . Smokeless tobacco: Current User    Types: Snuff  . Alcohol Use: No    Family History  Problem Relation Age of Onset  . Heart disease Mother   . Heart attack Father   . Cancer Brother   . Diabetes Brother   . Heart disease Brother   . Hyperlipidemia Brother     No Known Allergies   Current outpatient prescriptions:  .  Acetaminophen (ARTHRITIS PAIN RELIEVER PO), Take 360 mg by mouth as needed., Disp: , Rfl:  .  amLODipine (NORVASC) 5 MG tablet, Take 5 mg by mouth daily., Disp: , Rfl: 12 .  aspirin 325 MG tablet, Take 325 mg by mouth daily., Disp: , Rfl:  .  citalopram (CELEXA) 20 MG tablet, Take 1 tablet by mouth daily., Disp: , Rfl:  .  hydrochlorothiazide (HYDRODIURIL) 12.5 MG tablet, Take 12.5 mg by mouth daily., Disp: , Rfl:  .  isosorbide mononitrate (IMDUR) 60 MG 24 hr tablet, Take 60 mg by  mouth daily., Disp: , Rfl: 3 .  meclizine (ANTIVERT) 25 MG tablet, Take 25 mg by mouth 3 (three) times daily as needed. , Disp: , Rfl: 3 .  metFORMIN (GLUCOPHAGE) 1000 MG tablet, Take 1 tablet by mouth 2 (two) times daily., Disp: , Rfl:  .  pramipexole (MIRAPEX) 0.5 MG tablet, Take 0.5 mg by mouth daily as needed. , Disp: , Rfl: 12 .  quinapril (ACCUPRIL) 40 MG tablet, Take 40 mg by mouth daily., Disp: , Rfl:   Filed Vitals:   06/25/14 0836 06/25/14 0837  BP: 196/66 191/68  Pulse: 65   Height: 6\' 1"  (1.854 m)   Weight: 188 lb 8 oz (85.503 kg)   SpO2: 97%     Body mass index is 24.87 kg/(m^2).         tReview of Systems denies any active chest pain or dyspnea on exertion.   has occasional pain in legs with walking and while lying flat. Occasionally complains of numbness in the arms or legs and dizziness. Had lumbar spine problems and was tentatively scheduled for surgery which now apparently has improved. Other systems negative and complete review of systems Objective:   Physical Exam BP 191/68 mmHg  Pulse 65  Ht 6\' 1"  (1.854 m)  Wt 188 lb 8 oz (85.503 kg)  BMI 24.87 kg/m2  SpO2 97%  Gen.-alert and oriented x3 in no apparent distress HEENT normal for age Lungs no rhonchi or wheezing Cardiovascular regular rhythm no murmurs carotid pulses 3+ palpable--soft bruits bilaterally over carotid bifurcations Abdomen soft nontender no palpable masses Musculoskeletal free of  major deformities Skin clear -no rashes Neurologic normal Lower extremities 3+ femoral and 2+ popliteal pulses palpable bilaterally. Right leg with 2+ PT left leg with 1+ PT pulse palpable  Today I reviewed the records supplied by Dr. John Gage. Also reviewed the report of the CT angiogram of the neck which revealed a greater than 80% right ICA stenosis and mild left ICA stenosis. I also reviewed the images of the CT angiogram by computer. It appears that the right ICA stenosis approximates 90% and the left ICA  stenosis is mild. Patient also has evidence of note right occipital lobe infarct and laminar necrosis with no evidence of hemorrhage.  I also had a phone discussion with Dr. John Gage regarding this patient and his general medical condition and also consulted DR Revankar regarding his cardiac status. His most recent evaluation revealed a normal ejection fraction with no evidence of ischemia.      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 Risks and benefits thoroughly discussed with patient and he would like to proceed  45 minutes time spent in perioperative counseling of patient  and coordination of care and discussion of situation with referring doctors Dr. Gage and Dr.Revankar      

## 2014-06-28 NOTE — Interval H&P Note (Signed)
History and Physical Interval Note:  06/28/2014 7:26 AM  Gary SavoyBilly J Dills  has presented today for surgery, with the diagnosis of Right carotid artery stenosis I65.21  The various methods of treatment have been discussed with the patient and family. After consideration of risks, benefits and other options for treatment, the patient has consented to  Procedure(s): ENDARTERECTOMY CAROTID (Right) as a surgical intervention .  The patient's history has been reviewed, patient examined, no change in status, stable for surgery.  I have reviewed the patient's chart and labs.  Questions were answered to the patient's satisfaction.     Josephina GipLawson, Auther Lyerly

## 2014-06-28 NOTE — Anesthesia Postprocedure Evaluation (Signed)
Anesthesia Post Note  Patient: Gary Kirby  Procedure(s) Performed: Procedure(s) (LRB): RIGHT CAROTID ARTERY ENDARTERECTOMY  (Right) WITH DACRON PATCH ANGIOPLASTY (Right)  Anesthesia type: general  Patient location: PACU  Post pain: Pain level controlled  Post assessment: Patient's Cardiovascular Status Stable  Last Vitals:  Filed Vitals:   06/28/14 1009  BP: 106/45  Pulse: 69  Temp:   Resp: 14    Post vital signs: Reviewed and stable  Level of consciousness: sedated  Complications: No apparent anesthesia complications

## 2014-06-28 NOTE — Progress Notes (Signed)
      Patient doing well Incisional dressing clean and dry No tongue deviation, smile symmetric   S/P right CEA Glen Kesinger MAUREEN PA-C

## 2014-06-28 NOTE — Transfer of Care (Signed)
Immediate Anesthesia Transfer of Care Note  Patient: Gary Kirby  Procedure(s) Performed: Procedure(s): RIGHT CAROTID ARTERY ENDARTERECTOMY  (Right) WITH DACRON PATCH ANGIOPLASTY (Right)  Patient Location: PACU  Anesthesia Type:General  Level of Consciousness: awake, alert  and oriented  Airway & Oxygen Therapy: Patient Spontanous Breathing and Patient connected to nasal cannula oxygen  Post-op Assessment: Report given to RN, Post -op Vital signs reviewed and stable and Patient moving all extremities X 4  Post vital signs: Reviewed and stable  Last Vitals:  Filed Vitals:   06/28/14 0546  BP: 190/64  Pulse: 65  Temp: 36.7 C    Complications: No apparent anesthesia complications

## 2014-06-28 NOTE — Anesthesia Preprocedure Evaluation (Addendum)
Anesthesia Evaluation  Patient identified by MRN, date of birth, ID band Patient awake    Reviewed: Allergy & Precautions, NPO status , Patient's Chart, lab work & pertinent test results  History of Anesthesia Complications (+) PONV  Airway Mallampati: I  TM Distance: >3 FB Neck ROM: Full    Dental  (+) Edentulous Upper, Edentulous Lower   Pulmonary shortness of breath,    Pulmonary exam normal       Cardiovascular hypertension, Pt. on medications + Peripheral Vascular Disease Normal cardiovascular exam    Neuro/Psych CVA negative psych ROS   GI/Hepatic negative GI ROS, Neg liver ROS,   Endo/Other  diabetes  Renal/GU negative Renal ROS     Musculoskeletal   Abdominal   Peds  Hematology   Anesthesia Other Findings   Reproductive/Obstetrics                           Anesthesia Physical Anesthesia Plan  ASA: III  Anesthesia Plan: General   Post-op Pain Management:    Induction: Intravenous  Airway Management Planned: Oral ETT  Additional Equipment: Arterial line  Intra-op Plan:   Post-operative Plan: Extubation in OR  Informed Consent: I have reviewed the patients History and Physical, chart, labs and discussed the procedure including the risks, benefits and alternatives for the proposed anesthesia with the patient or authorized representative who has indicated his/her understanding and acceptance.   Dental advisory given  Plan Discussed with: CRNA, Anesthesiologist and Surgeon  Anesthesia Plan Comments:        Anesthesia Quick Evaluation

## 2014-06-28 NOTE — Addendum Note (Signed)
Addendum  created 06/28/14 1048 by Quentin OreLuz E Mabry Santarelli, CRNA   Modules edited: Anesthesia Flowsheet

## 2014-06-28 NOTE — Anesthesia Procedure Notes (Signed)
Procedure Name: Intubation Date/Time: 06/28/2014 7:40 AM Performed by: Kyung Rudd Pre-anesthesia Checklist: Patient identified, Emergency Drugs available, Suction available, Patient being monitored and Timeout performed Patient Re-evaluated:Patient Re-evaluated prior to inductionOxygen Delivery Method: Circle system utilized Preoxygenation: Pre-oxygenation with 100% oxygen Intubation Type: IV induction Ventilation: Mask ventilation without difficulty and Oral airway inserted - appropriate to patient size Laryngoscope Size: Mac and 3 Grade View: Grade I Tube type: Oral Tube size: 7.5 mm Number of attempts: 1 Airway Equipment and Method: Stylet and LTA kit utilized Placement Confirmation: ETT inserted through vocal cords under direct vision,  positive ETCO2 and breath sounds checked- equal and bilateral Secured at: 21 cm Tube secured with: Tape Dental Injury: Teeth and Oropharynx as per pre-operative assessment

## 2014-06-29 LAB — CBC
HCT: 29.2 % — ABNORMAL LOW (ref 39.0–52.0)
Hemoglobin: 9.1 g/dL — ABNORMAL LOW (ref 13.0–17.0)
MCH: 31.5 pg (ref 26.0–34.0)
MCHC: 31.2 g/dL (ref 30.0–36.0)
MCV: 101 fL — AB (ref 78.0–100.0)
Platelets: 121 10*3/uL — ABNORMAL LOW (ref 150–400)
RBC: 2.89 MIL/uL — ABNORMAL LOW (ref 4.22–5.81)
RDW: 19.7 % — ABNORMAL HIGH (ref 11.5–15.5)
WBC: 3.5 10*3/uL — ABNORMAL LOW (ref 4.0–10.5)

## 2014-06-29 LAB — GLUCOSE, CAPILLARY
Glucose-Capillary: 201 mg/dL — ABNORMAL HIGH (ref 65–99)
Glucose-Capillary: 119 mg/dL — ABNORMAL HIGH (ref 65–99)

## 2014-06-29 LAB — BASIC METABOLIC PANEL
Anion gap: 7 (ref 5–15)
BUN: 10 mg/dL (ref 6–20)
CO2: 25 mmol/L (ref 22–32)
CREATININE: 0.49 mg/dL — AB (ref 0.61–1.24)
Calcium: 8.4 mg/dL — ABNORMAL LOW (ref 8.9–10.3)
Chloride: 113 mmol/L — ABNORMAL HIGH (ref 101–111)
GFR calc Af Amer: >60 mL/min (ref >60)
GFR calc non Af Amer: >60 mL/min (ref >60)
Glucose, Bld: 108 mg/dL — ABNORMAL HIGH (ref 65–99)
POTASSIUM: 3.5 mmol/L (ref 3.5–5.1)
Sodium: 145 mmol/L (ref 135–145)

## 2014-06-29 MED ORDER — HYDROCODONE-ACETAMINOPHEN 7.5-325 MG PO TABS: 1.0000 | ORAL_TABLET | ORAL | Status: DC | PRN

## 2014-06-29 NOTE — Progress Notes (Signed)
Patients discharge instructions reviewed, wife and daughter Nash DimmerKerry at the bedside. All immediate questions answered. Prescription given to the patient. VSS, no acute distress noted. Pt discharged via wheelchair.

## 2014-06-29 NOTE — Progress Notes (Addendum)
[facility] [place]  Subjective  - Per nursing the patient was unable to void last night and a foley was ordered.  The foley was then D/C'd this am at 0645 and pending independent voiding.   Objective 141/56 56 98.7 F (37.1 C) (Oral) 14 97%  Intake/Output Summary (Last 24 hours) at 06/29/14 0717 Last data filed at 06/29/14 0356  Gross per 24 hour  Intake   3095 ml  Output    250 ml  Net   2845 ml    Grip 5/5 equal bilaterally Incision clean and dry without hematoma No tongue deviation and smile is symmetric Heart RRR Lungs non labored breathing  Assessment/Planning: POD #1 Right CEA  Ambulated, taking PO's well and speech is clear. Pending independent voiding he will be discharged home and a follow up visit will be scheduled with our office in 2 weeks.  Clinton GallantCOLLINS, EMMA [facility] 06/29/2014 7:17 AM --  Laboratory Lab Results:  Recent Labs  06/28/14 1500 06/29/14 0437  WBC 9.9 3.5*  HGB 9.0* 9.1*  HCT 28.5* 29.2*  PLT 174 121*   BMET  Recent Labs  06/27/14 1158 06/28/14 1500 06/29/14 0437  NA 138  --  145  K 4.5  --  3.5  CL 103  --  113*  CO2 26  --  25  GLUCOSE 228*  --  108*  BUN 25*  --  10  CREATININE 1.21 1.02 0.49*  CALCIUM 9.2  --  8.4*    COAG Lab Results  Component Value Date   INR 1.04 06/27/2014   No results found for: PTT  Agree with above. Doing well. Home today after he voids.  Waverly Ferrarihristopher Makita Blow, MD, FACS Beeper [phone] Office: [phone]

## 2014-06-29 NOTE — Progress Notes (Signed)
Pt unable to void. No documented urine output this admission. MD notified order for foley obtained and carried out.

## 2014-07-01 ENCOUNTER — Encounter (HOSPITAL_COMMUNITY): Payer: Self-pay | Admitting: Vascular Surgery

## 2014-07-01 ENCOUNTER — Telehealth: Payer: Self-pay | Admitting: Vascular Surgery

## 2014-07-01 NOTE — Telephone Encounter (Signed)
-----   Message from Chuck Hinthristopher S Dickson, MD sent at 06/30/2014  7:29 AM EDT ----- Regarding: no er This patient called about difficulty swallowing. He was d/c'd Saturday. I told him to come to the ED. I came in and waited and he never showed up. I called this AM, he said he felt better. CD

## 2014-07-01 NOTE — Telephone Encounter (Addendum)
-----   Message from Sharee PimpleMarilyn K McChesney, RN sent at 07/01/2014  9:29 AM EDT ----- Regarding: Schedule   ----- Message -----    From: Lars MageEmma M Collins, PA-C    Sent: 06/30/2014   8:42 AM      To: Vvs Charge Pool  S/P CEA needs 2 week f/u with Dr. Hart RochesterLawson   07/01/14: lm for pt re appt, dpm

## 2014-07-01 NOTE — Discharge Summary (Signed)
[facility] Discharge Summary   Patient ID:  Gary Kirby MRN: [id number] DOB/AGE: [birth year] 81 y.o.  Admit date: 06/28/2014 Discharge date: 06/29/2014 Date of Surgery: 06/28/2014 Surgeon: Surgeon(s): Pryor Ochoa, MD  Admission Diagnosis: Right carotid artery stenosis I65.21  Discharge Diagnoses:  Right carotid artery stenosis I65.21  Secondary Diagnoses: Past Medical History  Diagnosis Date  . Anemia   . Hypertension   . Hyperlipidemia   . Shortness of breath dyspnea     with ambulation  . Stroke   . Diabetes mellitus without complication     Type 2  . HOH (hard of hearing)     Procedure(s): RIGHT CAROTID ARTERY ENDARTERECTOMY  WITH DACRON PATCH ANGIOPLASTY  Discharged Condition: good  HPI: 81 year old male was referred by Dr. [name] for evaluation of severe right carotid stenosis. Patient has a remote history of CVA in 2014. He has not had recent symptoms other than occasional changes in the visual field of his right eye which are nonspecific. He has previously had ultrasounds performed in [facility]. He recently had a CT angiogram which revealed a severe right ICA stenosis and a mild to moderate left ICA stenosis and he was referred for further evaluation. He does have a history of coronary artery bypass grafting in 2001 by Dr. Kathlee Nations Trigt. He was seen recently by his cardiologist Dr.Revankar who cleared the patient for lumbar spine surgery. The CT angiogram was then performed which revealed the severe carotid stenosis and he was referred here for further evaluation.    Hospital Course:  Gary Kirby is a 81 y.o. male is S/P Right Procedure(s): RIGHT CAROTID ARTERY ENDARTERECTOMY  WITH DACRON PATCH ANGIOPLASTY POD# 1 Per nursing the patient was unable to void last night and a foley was ordered. The foley was then D/C'd this am at 0645 and pending independent voiding.  Once he was able to void , and tolerated PO's and after  ambulating with the nursing staff he was discharged home in stable condition.     Significant Diagnostic Studies: CBC Lab Results  Component Value Date   WBC 3.5* 06/29/2014   HGB 9.1* 06/29/2014   HCT 29.2* 06/29/2014   MCV 101.0* 06/29/2014   PLT 121* 06/29/2014    BMET    Component Value Date/Time   NA 145 06/29/2014 0437   K 3.5 06/29/2014 0437   CL 113* 06/29/2014 0437   CO2 25 06/29/2014 0437   GLUCOSE 108* 06/29/2014 0437   BUN 10 06/29/2014 0437   CREATININE 0.49* 06/29/2014 0437   CALCIUM 8.4* 06/29/2014 0437   GFRNONAA >60 06/29/2014 0437   GFRAA >60 06/29/2014 0437   COAG Lab Results  Component Value Date   INR 1.04 06/27/2014     Disposition:  Discharge to :Home Discharge Instructions    Call MD for:  redness, tenderness, or signs of infection (pain, swelling, bleeding, redness, odor or green/yellow discharge around incision site)    Complete by:  As directed      Call MD for:  severe or increased pain, loss or decreased feeling  in affected limb(s)    Complete by:  As directed      Call MD for:  temperature >100.5    Complete by:  As directed      Discharge instructions    Complete by:  As directed   You may shower     Driving Restrictions    Complete by:  As directed   No driving for  2 weeks     Increase activity slowly    Complete by:  As directed   Walk with assistance use walker or cane as needed     Lifting restrictions    Complete by:  As directed   No lifting for 6 weeks     Resume previous diet    Complete by:  As directed             Medication List    TAKE these medications        acetaminophen 500 MG tablet  Commonly known as:  TYLENOL  Take 500 mg by mouth every 6 (six) hours as needed for mild pain or moderate pain.     amLODipine 5 MG tablet  Commonly known as:  NORVASC  Take 5 mg by mouth daily.     aspirin 325 MG tablet  Take 325 mg by mouth daily.     citalopram 20 MG tablet  Commonly known as:  CELEXA   Take 20 mg by mouth daily.     folic acid 800 MCG tablet  Commonly known as:  FOLVITE  Take 800 mcg by mouth daily.     hydrochlorothiazide 12.5 MG tablet  Commonly known as:  HYDRODIURIL  Take 12.5 mg by mouth daily.     HYDROcodone-acetaminophen 7.5-325 MG per tablet  Commonly known as:  NORCO  Take 1 tablet by mouth daily as needed for moderate pain or severe pain.     HYDROcodone-acetaminophen 7.5-325 MG per tablet  Commonly known as:  NORCO  Take 1-2 tablets by mouth every 4 (four) hours as needed for moderate pain or severe pain.     isosorbide mononitrate 60 MG 24 hr tablet  Commonly known as:  IMDUR  Take 60 mg by mouth daily.     JANUVIA 100 MG tablet  Generic drug:  sitaGLIPtin  Take 100 mg by mouth daily.     meclizine 25 MG tablet  Commonly known as:  ANTIVERT  Take 25 mg by mouth 3 (three) times daily as needed for dizziness.     metFORMIN 1000 MG tablet  Commonly known as:  GLUCOPHAGE  Take 1 tablet by mouth 2 (two) times daily.     pramipexole 0.5 MG tablet  Commonly known as:  MIRAPEX  Take 0.5 mg by mouth daily as needed (restless legs syndrome).     pravastatin 80 MG tablet  Commonly known as:  PRAVACHOL  Take 80 mg by mouth daily.     quinapril 40 MG tablet  Commonly known as:  ACCUPRIL  Take 40 mg by mouth daily.       Verbal and written Discharge instructions given to the patient. Wound care per Discharge AVS     Follow-up Information    Follow up with Josephina Gip, MD In 2 weeks.   Specialties:  Vascular Surgery, Interventional Cardiology, Cardiology   Why:  sent message to office   Contact information:   [address] [place] [address] [phone]       Signed: Clinton Gallant [facility] 07/01/2014, 10:06 AM  --- For VQI Registry use --- Instructions: Press F2 to tab through selections.  Delete question if not applicable.   Modified Rankin score at D/C (0-6): Rankin Score=0  IV medication needed for:  1. Hypertension:  No 2. Hypotension: No  Post-op Complications: No  1. Post-op CVA or TIA: No  If yes: Event classification (right eye, left eye, right cortical, left cortical, verterobasilar, other):  If yes: Timing of event (intra-op, <6 hrs post-op, >=6 hrs post-op, unknown):   2. CN injury: No  If yes: CN  injuried   3. Myocardial infarction: No  If yes: Dx by (EKG or clinical, Troponin):   4.  CHF: No  5.  Dysrhythmia (new): No  6. Wound infection: No  7. Reperfusion symptoms: No  8. Return to OR: No  If yes: return to OR for (bleeding, neurologic, other CEA incision, other):   Discharge medications: Statin use:  Yes ASA use:  Yes Beta blocker use:  No  for medical reason   ACE-Inhibitor use:  Yes P2Y12 Antagonist use: [x ] None, [ ]  Plavix, [ ]  Plasugrel, [ ]  Ticlopinine, [ ]  Ticagrelor, [ ]  Other, [ ]  No for medical reason, [ ]  Non-compliant, [ ]  Not-indicated Anti-coagulant use:  [x ] None, [ ]  Warfarin, [ ]  Rivaroxaban, [ ]  Dabigatran, [ ]  Other, [ ]  No for medical reason, [ ]  Non-compliant, [ ]  Not-indicated

## 2014-07-02 LAB — TYPE AND SCREEN
ABO/RH(D): O POS
ANTIBODY SCREEN: NEGATIVE
Unit division: 0
Unit division: 0

## 2014-07-15 ENCOUNTER — Encounter: Payer: Self-pay | Admitting: Vascular Surgery

## 2014-07-16 ENCOUNTER — Encounter: Payer: Medicare Other | Admitting: Vascular Surgery

## 2014-07-23 DIAGNOSIS — E119 Type 2 diabetes mellitus without complications: Secondary | ICD-10-CM | POA: Diagnosis not present

## 2014-07-23 DIAGNOSIS — I639 Cerebral infarction, unspecified: Secondary | ICD-10-CM | POA: Diagnosis not present

## 2014-07-23 DIAGNOSIS — Z79899 Other long term (current) drug therapy: Secondary | ICD-10-CM | POA: Diagnosis not present

## 2014-07-23 DIAGNOSIS — I6529 Occlusion and stenosis of unspecified carotid artery: Secondary | ICD-10-CM | POA: Diagnosis not present

## 2014-07-23 DIAGNOSIS — D649 Anemia, unspecified: Secondary | ICD-10-CM | POA: Diagnosis not present

## 2014-07-23 DIAGNOSIS — I2510 Atherosclerotic heart disease of native coronary artery without angina pectoris: Secondary | ICD-10-CM | POA: Diagnosis not present

## 2014-07-23 DIAGNOSIS — I10 Essential (primary) hypertension: Secondary | ICD-10-CM | POA: Diagnosis not present

## 2014-07-23 DIAGNOSIS — E785 Hyperlipidemia, unspecified: Secondary | ICD-10-CM | POA: Diagnosis not present

## 2014-07-29 ENCOUNTER — Encounter: Payer: Self-pay | Admitting: Vascular Surgery

## 2014-07-30 ENCOUNTER — Encounter: Payer: Self-pay | Admitting: Vascular Surgery

## 2014-07-30 ENCOUNTER — Encounter (HOSPITAL_COMMUNITY): Payer: Self-pay

## 2014-07-30 ENCOUNTER — Ambulatory Visit (INDEPENDENT_AMBULATORY_CARE_PROVIDER_SITE_OTHER): Payer: Self-pay | Admitting: Vascular Surgery

## 2014-07-30 VITALS — BP 144/51 | HR 67 | Resp 16 | Ht 72.0 in | Wt 185.0 lb

## 2014-07-30 DIAGNOSIS — Z48812 Encounter for surgical aftercare following surgery on the circulatory system: Secondary | ICD-10-CM

## 2014-07-30 DIAGNOSIS — I6521 Occlusion and stenosis of right carotid artery: Secondary | ICD-10-CM

## 2014-07-30 NOTE — Progress Notes (Signed)
Filed Vitals:   07/30/14 1308 07/30/14 1309  BP: 161/58 144/51  Pulse: 72 67  Resp: 18 16  Height: 6' (1.829 m)   Weight: 185 lb (83.915 kg)

## 2014-07-30 NOTE — Addendum Note (Signed)
Addended by: Adria Dill L on: 07/30/2014 02:33 PM   Modules accepted: Orders

## 2014-07-30 NOTE — Progress Notes (Signed)
Subjective:     Patient ID: Gary Kirby, male   DOB: July 26, 1933, 81 y.o.   MRN: [id number]  HPI this 81 year old male returns 4 weeks post right carotid endarterectomy for an 80% right ICA stenosis. Patient was asymptomatic. He denies any neurologic symptoms since his surgery including left-sided weakness, aphasia, amaurosis fugax, diplopia, blurred vision, or syncope. He is swallowing well and has no hoarseness. He takes one aspirin per day.  Past Medical History  Diagnosis Date  . Anemia   . Hypertension   . Hyperlipidemia   . Shortness of breath dyspnea     with ambulation  . Stroke   . Diabetes mellitus without complication     Type 2  . HOH (hard of hearing)     History  Substance Use Topics  . Smoking status: Never Smoker   . Smokeless tobacco: Current User    Types: Snuff  . Alcohol Use: No    Family History  Problem Relation Age of Onset  . Heart disease Mother   . Heart attack Father   . Cancer Brother   . Diabetes Brother   . Heart disease Brother   . Hyperlipidemia Brother     No Known Allergies   Current outpatient prescriptions:  .  acetaminophen (TYLENOL) 500 MG tablet, Take 500 mg by mouth every 6 (six) hours as needed for mild pain or moderate pain., Disp: , Rfl:  .  amLODipine (NORVASC) 5 MG tablet, Take 5 mg by mouth daily., Disp: , Rfl: 12 .  aspirin 325 MG tablet, Take 325 mg by mouth daily., Disp: , Rfl:  .  citalopram (CELEXA) 20 MG tablet, Take 20 mg by mouth daily. , Disp: , Rfl:  .  folic acid (FOLVITE) 800 MCG tablet, Take 800 mcg by mouth daily., Disp: , Rfl:  .  hydrochlorothiazide (HYDRODIURIL) 12.5 MG tablet, Take 12.5 mg by mouth daily., Disp: , Rfl:  .  HYDROcodone-acetaminophen (NORCO) 7.5-325 MG per tablet, Take 1 tablet by mouth daily as needed for moderate pain or severe pain. , Disp: , Rfl:  .  HYDROcodone-acetaminophen (NORCO) 7.5-325 MG per tablet, Take 1-2 tablets by mouth every 4 (four) hours as needed for moderate pain or  severe pain., Disp: 30 tablet, Rfl: 0 .  isosorbide mononitrate (IMDUR) 60 MG 24 hr tablet, Take 60 mg by mouth daily., Disp: , Rfl: 3 .  JANUVIA 100 MG tablet, Take 100 mg by mouth daily., Disp: , Rfl:  .  meclizine (ANTIVERT) 25 MG tablet, Take 25 mg by mouth 3 (three) times daily as needed for dizziness. , Disp: , Rfl: 3 .  metFORMIN (GLUCOPHAGE) 1000 MG tablet, Take 1 tablet by mouth 2 (two) times daily., Disp: , Rfl:  .  pramipexole (MIRAPEX) 0.5 MG tablet, Take 0.5 mg by mouth daily as needed (restless legs syndrome). , Disp: , Rfl: 12 .  pravastatin (PRAVACHOL) 80 MG tablet, Take 80 mg by mouth daily., Disp: , Rfl: 12 .  quinapril (ACCUPRIL) 40 MG tablet, Take 40 mg by mouth daily., Disp: , Rfl:   Filed Vitals:   07/30/14 1308 07/30/14 1309  BP: 161/58 144/51  Pulse: 72 67  Resp: 18 16  Height: 6' (1.829 m)   Weight: 185 lb (83.915 kg)     Body mass index is 25.08 kg/(m^2).           Review of Systems denies chest pain, dyspnea on exertion since surgery     Objective:   Physical Exam BP  144/51 mmHg  Pulse 67  Resp 16  Ht 6' (1.829 m)  Wt 185 lb (83.915 kg)  BMI 25.08 kg/m2  General well-developed well-nourished male in no apparent distress alert and oriented 3 Right neck incision is healing nicely with no swelling. 3+ carotid pulse with no audible bruit Left neck with 3+ pulse and no bruit audible Neurologic exam normal     Assessment:     Doing well 4 weeks post right carotid endarterectomy for severe asymptomatic right ICA stenosis with mild disease and contralateral left ICA    Plan:     Return in 6 months with follow-up carotid duplex exam unless develops new neurologic symptoms in the interim

## 2014-08-28 DIAGNOSIS — M79642 Pain in left hand: Secondary | ICD-10-CM | POA: Diagnosis not present

## 2014-10-09 DIAGNOSIS — R42 Dizziness and giddiness: Secondary | ICD-10-CM | POA: Diagnosis not present

## 2014-10-10 DIAGNOSIS — R42 Dizziness and giddiness: Secondary | ICD-10-CM | POA: Diagnosis not present

## 2014-10-10 DIAGNOSIS — F329 Major depressive disorder, single episode, unspecified: Secondary | ICD-10-CM | POA: Diagnosis not present

## 2014-10-10 DIAGNOSIS — E119 Type 2 diabetes mellitus without complications: Secondary | ICD-10-CM | POA: Diagnosis not present

## 2014-10-10 DIAGNOSIS — M25561 Pain in right knee: Secondary | ICD-10-CM | POA: Diagnosis not present

## 2014-10-10 DIAGNOSIS — E785 Hyperlipidemia, unspecified: Secondary | ICD-10-CM | POA: Diagnosis not present

## 2014-10-17 DIAGNOSIS — M170 Bilateral primary osteoarthritis of knee: Secondary | ICD-10-CM | POA: Diagnosis not present

## 2014-11-11 DIAGNOSIS — Z0000 Encounter for general adult medical examination without abnormal findings: Secondary | ICD-10-CM | POA: Diagnosis not present

## 2014-11-11 DIAGNOSIS — M818 Other osteoporosis without current pathological fracture: Secondary | ICD-10-CM | POA: Diagnosis not present

## 2014-11-11 DIAGNOSIS — E119 Type 2 diabetes mellitus without complications: Secondary | ICD-10-CM | POA: Diagnosis not present

## 2014-11-11 DIAGNOSIS — Z9181 History of falling: Secondary | ICD-10-CM | POA: Diagnosis not present

## 2014-11-11 DIAGNOSIS — E785 Hyperlipidemia, unspecified: Secondary | ICD-10-CM | POA: Diagnosis not present

## 2014-11-11 DIAGNOSIS — Z125 Encounter for screening for malignant neoplasm of prostate: Secondary | ICD-10-CM | POA: Diagnosis not present

## 2014-12-05 DIAGNOSIS — E119 Type 2 diabetes mellitus without complications: Secondary | ICD-10-CM | POA: Diagnosis not present

## 2014-12-05 DIAGNOSIS — H524 Presbyopia: Secondary | ICD-10-CM | POA: Diagnosis not present

## 2014-12-05 DIAGNOSIS — H26493 Other secondary cataract, bilateral: Secondary | ICD-10-CM | POA: Diagnosis not present

## 2014-12-17 DIAGNOSIS — Z961 Presence of intraocular lens: Secondary | ICD-10-CM | POA: Diagnosis not present

## 2015-01-29 ENCOUNTER — Encounter: Payer: Self-pay | Admitting: Vascular Surgery

## 2015-02-04 ENCOUNTER — Ambulatory Visit (INDEPENDENT_AMBULATORY_CARE_PROVIDER_SITE_OTHER): Payer: Medicare Other | Admitting: Vascular Surgery

## 2015-02-04 ENCOUNTER — Encounter: Payer: Self-pay | Admitting: Vascular Surgery

## 2015-02-04 ENCOUNTER — Other Ambulatory Visit: Payer: Self-pay | Admitting: Vascular Surgery

## 2015-02-04 ENCOUNTER — Ambulatory Visit (HOSPITAL_COMMUNITY)
Admission: RE | Admit: 2015-02-04 | Discharge: 2015-02-04 | Disposition: A | Discharge status: Home / Self Care | Payer: Medicare Other | Source: Ambulatory Visit | Attending: Vascular Surgery | Admitting: Vascular Surgery

## 2015-02-04 VITALS — BP 184/77 | HR 70 | Ht 72.0 in | Wt 199.8 lb

## 2015-02-04 DIAGNOSIS — I6529 Occlusion and stenosis of unspecified carotid artery: Secondary | ICD-10-CM | POA: Insufficient documentation

## 2015-02-04 DIAGNOSIS — Z48812 Encounter for surgical aftercare following surgery on the circulatory system: Secondary | ICD-10-CM | POA: Diagnosis not present

## 2015-02-04 DIAGNOSIS — I6523 Occlusion and stenosis of bilateral carotid arteries: Secondary | ICD-10-CM

## 2015-02-04 DIAGNOSIS — I6521 Occlusion and stenosis of right carotid artery: Secondary | ICD-10-CM | POA: Diagnosis not present

## 2015-02-04 NOTE — Progress Notes (Addendum)
[facility] [place]  Patient name: Gary Kirby MRN: [id number] DOB: 08/17/1933 Sex: male  REASON FOR VISIT: follow-up  HPI: Gary Kirby is a 81 y.o. male who status post right carotid endarterectomy on 06/28/2014. At that time he had asymptomatic greater than 80% right internal carotid artery stenosis. He denies any amaurosis fugax, hemiparesis and receptive or expressive aphasia. He does report dizziness. This occurs with either changing positions in bed or with rising from a lying position. He does have a history of vertigo.  He is on maximum medical management with aspirin and a statin. His hypertension is managed on Norvasc.  Past Medical History  Diagnosis Date  . Anemia   . Hypertension   . Hyperlipidemia   . Shortness of breath dyspnea     with ambulation  . Stroke (HCC)   . Diabetes mellitus without complication (HCC)     Type 2  . HOH (hard of hearing)     Family History  Problem Relation Age of Onset  . Heart disease Mother   . Heart attack Father   . Cancer Brother   . Diabetes Brother   . Heart disease Brother   . Hyperlipidemia Brother     SOCIAL HISTORY: Social History  Substance Use Topics  . Smoking status: Never Smoker   . Smokeless tobacco: Current User    Types: Snuff  . Alcohol Use: No    No Known Allergies  Current Outpatient Prescriptions  Medication Sig Dispense Refill  . acetaminophen (TYLENOL) 500 MG tablet Take 500 mg by mouth every 6 (six) hours as needed for mild pain or moderate pain.    Marland Kitchen amLODipine (NORVASC) 5 MG tablet Take 5 mg by mouth daily.  12  . aspirin 325 MG tablet Take 325 mg by mouth daily.    . citalopram (CELEXA) 20 MG tablet Take 20 mg by mouth daily.     . folic acid (FOLVITE) 800 MCG tablet Take 800 mcg by mouth daily.    . hydrochlorothiazide (HYDRODIURIL) 12.5 MG tablet Take 12.5 mg by mouth daily.    Marland Kitchen HYDROcodone-acetaminophen (NORCO) 7.5-325 MG per tablet Take 1 tablet by mouth  daily as needed for moderate pain or severe pain.     Marland Kitchen HYDROcodone-acetaminophen (NORCO) 7.5-325 MG per tablet Take 1-2 tablets by mouth every 4 (four) hours as needed for moderate pain or severe pain. 30 tablet 0  . isosorbide mononitrate (IMDUR) 60 MG 24 hr tablet Take 60 mg by mouth daily.  3  . JANUVIA 100 MG tablet Take 100 mg by mouth daily.    . meclizine (ANTIVERT) 25 MG tablet Take 25 mg by mouth 3 (three) times daily as needed for dizziness.   3  . metFORMIN (GLUCOPHAGE) 1000 MG tablet Take 1 tablet by mouth 2 (two) times daily.    . pramipexole (MIRAPEX) 0.5 MG tablet Take 0.5 mg by mouth daily as needed (restless legs syndrome).   12  . pravastatin (PRAVACHOL) 80 MG tablet Take 80 mg by mouth daily.  12  . quinapril (ACCUPRIL) 40 MG tablet Take 40 mg by mouth daily.     No current facility-administered medications for this visit.    REVIEW OF SYSTEMS:   denotes positive finding,  denotes negative finding Cardiac  Comments:  Chest pain or chest pressure:    Shortness of breath upon exertion:    Short of breath when lying flat:    Irregular heart rhythm:  Vascular    Pain in calf, thigh, or hip brought on by ambulation: x   Pain in feet at night that wakes you up from your sleep:  x   Blood clot in your veins:    Leg swelling:         Pulmonary    Oxygen at home:    Productive cough:     Wheezing:         Neurologic    Sudden weakness in arms or legs:     Sudden numbness in arms or legs:     Sudden onset of difficulty speaking or slurred speech:    Temporary loss of vision in one eye:     Problems with dizziness:         Gastrointestinal    Blood in stool:     Vomited blood:         Genitourinary    Burning when urinating:     Blood in urine:        Psychiatric    Major depression:         Hematologic    Bleeding problems:    Problems with blood clotting too easily:        Skin    Rashes or ulcers:        Constitutional    Fever or  chills:      PHYSICAL EXAM: Filed Vitals:   02/04/15 1609 02/04/15 1610  BP: 175/71 184/77  Pulse: 70   Height: 6' (1.829 m)   Weight: 199 lb 12.8 oz (90.629 kg)   SpO2: 99%     GENERAL: The patient is a well-nourished male, in no acute distress. The vital signs are documented above. CARDIAC: There is a regular rate and rhythm.  VASCULAR: well healed right carotid endarterectomy scar, no carotid bruits detected. 2+ radial pulses bilaterally. PULMONARY: There is good air exchange bilaterally without wheezing or rales. MUSCULOSKELETAL: There are no major deformities or cyanosis. NEUROLOGIC: No focal weakness or paresthesias are detected. SKIN: There are no ulcers or rashes noted. PSYCHIATRIC: The patient has a normal affect.  DATA:  Carotid duplex 02/04/2015 Patent right carotid endarterectomy site. Left carotid duplex was not performed today due to unknown reasons.   MEDICAL ISSUES  Carotid artery stenosis  Status post right carotid endarterectomy 06/28/2014.  The patient has remained asymptomatic. He has not had any symptoms of stroke or TIA. He has had some episodes of dizziness this is likely secondary to vertigo or orthostatic hypotension based upon his history. He is on maximal medical management with aspirin, antihypertensives, and a statin. He will follow up in 6 months with bilateral carotid duplex.  Maris BergerKimberly Coady Train, PA-C [facility]   Agree with above assessment Preoperatively patient had a remote history of right brain CVA   he was noted to have an approximate 50% stenosis in his left ICA on CT angiogram of the neck    patient will return in 6 months for carotid duplex exam to look at both right and left internal carotid arteries If he becomes symptomatic in the interim he will be in touch with us for earlier evaluation  Continue daily aspirin

## 2015-03-26 DIAGNOSIS — E119 Type 2 diabetes mellitus without complications: Secondary | ICD-10-CM | POA: Diagnosis not present

## 2015-03-26 DIAGNOSIS — G2581 Restless legs syndrome: Secondary | ICD-10-CM | POA: Diagnosis not present

## 2015-03-26 DIAGNOSIS — I10 Essential (primary) hypertension: Secondary | ICD-10-CM | POA: Diagnosis not present

## 2015-03-26 DIAGNOSIS — E785 Hyperlipidemia, unspecified: Secondary | ICD-10-CM | POA: Diagnosis not present

## 2015-03-26 DIAGNOSIS — E1140 Type 2 diabetes mellitus with diabetic neuropathy, unspecified: Secondary | ICD-10-CM | POA: Diagnosis not present

## 2015-03-26 DIAGNOSIS — E663 Overweight: Secondary | ICD-10-CM | POA: Diagnosis not present

## 2015-05-28 DIAGNOSIS — I442 Atrioventricular block, complete: Secondary | ICD-10-CM | POA: Insufficient documentation

## 2015-05-28 DIAGNOSIS — Z794 Long term (current) use of insulin: Secondary | ICD-10-CM | POA: Diagnosis not present

## 2015-05-28 DIAGNOSIS — J189 Pneumonia, unspecified organism: Secondary | ICD-10-CM | POA: Diagnosis not present

## 2015-05-28 DIAGNOSIS — I4891 Unspecified atrial fibrillation: Secondary | ICD-10-CM | POA: Diagnosis not present

## 2015-05-28 DIAGNOSIS — E875 Hyperkalemia: Secondary | ICD-10-CM | POA: Diagnosis not present

## 2015-05-28 DIAGNOSIS — I517 Cardiomegaly: Secondary | ICD-10-CM | POA: Diagnosis not present

## 2015-05-28 DIAGNOSIS — Z951 Presence of aortocoronary bypass graft: Secondary | ICD-10-CM | POA: Diagnosis not present

## 2015-05-28 DIAGNOSIS — M1990 Unspecified osteoarthritis, unspecified site: Secondary | ICD-10-CM | POA: Diagnosis not present

## 2015-05-28 DIAGNOSIS — Z79899 Other long term (current) drug therapy: Secondary | ICD-10-CM | POA: Diagnosis not present

## 2015-05-28 DIAGNOSIS — E119 Type 2 diabetes mellitus without complications: Secondary | ICD-10-CM | POA: Diagnosis not present

## 2015-05-28 DIAGNOSIS — Z8673 Personal history of transient ischemic attack (TIA), and cerebral infarction without residual deficits: Secondary | ICD-10-CM | POA: Diagnosis not present

## 2015-05-28 DIAGNOSIS — Z7982 Long term (current) use of aspirin: Secondary | ICD-10-CM | POA: Diagnosis not present

## 2015-05-28 DIAGNOSIS — I340 Nonrheumatic mitral (valve) insufficiency: Secondary | ICD-10-CM | POA: Diagnosis not present

## 2015-05-28 DIAGNOSIS — I2510 Atherosclerotic heart disease of native coronary artery without angina pectoris: Secondary | ICD-10-CM | POA: Diagnosis not present

## 2015-05-28 DIAGNOSIS — I10 Essential (primary) hypertension: Secondary | ICD-10-CM | POA: Diagnosis not present

## 2015-05-28 DIAGNOSIS — I25708 Atherosclerosis of coronary artery bypass graft(s), unspecified, with other forms of angina pectoris: Secondary | ICD-10-CM | POA: Diagnosis not present

## 2015-05-28 DIAGNOSIS — I499 Cardiac arrhythmia, unspecified: Secondary | ICD-10-CM | POA: Diagnosis not present

## 2015-05-28 DIAGNOSIS — I361 Nonrheumatic tricuspid (valve) insufficiency: Secondary | ICD-10-CM | POA: Diagnosis not present

## 2015-05-28 DIAGNOSIS — J449 Chronic obstructive pulmonary disease, unspecified: Secondary | ICD-10-CM | POA: Diagnosis not present

## 2015-05-28 DIAGNOSIS — R001 Bradycardia, unspecified: Secondary | ICD-10-CM | POA: Diagnosis not present

## 2015-05-28 DIAGNOSIS — E1165 Type 2 diabetes mellitus with hyperglycemia: Secondary | ICD-10-CM | POA: Diagnosis not present

## 2015-06-02 DIAGNOSIS — R262 Difficulty in walking, not elsewhere classified: Secondary | ICD-10-CM | POA: Diagnosis not present

## 2015-06-02 DIAGNOSIS — Z950 Presence of cardiac pacemaker: Secondary | ICD-10-CM | POA: Diagnosis not present

## 2015-06-02 DIAGNOSIS — M25562 Pain in left knee: Secondary | ICD-10-CM | POA: Diagnosis not present

## 2015-06-03 DIAGNOSIS — E1165 Type 2 diabetes mellitus with hyperglycemia: Secondary | ICD-10-CM | POA: Diagnosis not present

## 2015-06-03 DIAGNOSIS — I4891 Unspecified atrial fibrillation: Secondary | ICD-10-CM | POA: Diagnosis not present

## 2015-06-03 DIAGNOSIS — I10 Essential (primary) hypertension: Secondary | ICD-10-CM | POA: Diagnosis not present

## 2015-06-03 DIAGNOSIS — Z950 Presence of cardiac pacemaker: Secondary | ICD-10-CM | POA: Diagnosis not present

## 2015-06-03 DIAGNOSIS — Z8673 Personal history of transient ischemic attack (TIA), and cerebral infarction without residual deficits: Secondary | ICD-10-CM | POA: Diagnosis not present

## 2015-06-03 DIAGNOSIS — Z951 Presence of aortocoronary bypass graft: Secondary | ICD-10-CM | POA: Diagnosis not present

## 2015-06-03 DIAGNOSIS — Z48812 Encounter for surgical aftercare following surgery on the circulatory system: Secondary | ICD-10-CM | POA: Diagnosis not present

## 2015-06-03 DIAGNOSIS — J449 Chronic obstructive pulmonary disease, unspecified: Secondary | ICD-10-CM | POA: Diagnosis not present

## 2015-06-03 DIAGNOSIS — M1991 Primary osteoarthritis, unspecified site: Secondary | ICD-10-CM | POA: Diagnosis not present

## 2015-06-03 DIAGNOSIS — I442 Atrioventricular block, complete: Secondary | ICD-10-CM | POA: Diagnosis not present

## 2015-06-03 DIAGNOSIS — I25118 Atherosclerotic heart disease of native coronary artery with other forms of angina pectoris: Secondary | ICD-10-CM | POA: Diagnosis not present

## 2015-06-04 DIAGNOSIS — I25118 Atherosclerotic heart disease of native coronary artery with other forms of angina pectoris: Secondary | ICD-10-CM | POA: Diagnosis not present

## 2015-06-04 DIAGNOSIS — I4891 Unspecified atrial fibrillation: Secondary | ICD-10-CM | POA: Diagnosis not present

## 2015-06-04 DIAGNOSIS — Z951 Presence of aortocoronary bypass graft: Secondary | ICD-10-CM | POA: Diagnosis not present

## 2015-06-04 DIAGNOSIS — Z8673 Personal history of transient ischemic attack (TIA), and cerebral infarction without residual deficits: Secondary | ICD-10-CM | POA: Diagnosis not present

## 2015-06-04 DIAGNOSIS — M1991 Primary osteoarthritis, unspecified site: Secondary | ICD-10-CM | POA: Diagnosis not present

## 2015-06-04 DIAGNOSIS — I442 Atrioventricular block, complete: Secondary | ICD-10-CM | POA: Diagnosis not present

## 2015-06-04 DIAGNOSIS — J449 Chronic obstructive pulmonary disease, unspecified: Secondary | ICD-10-CM | POA: Diagnosis not present

## 2015-06-04 DIAGNOSIS — I10 Essential (primary) hypertension: Secondary | ICD-10-CM | POA: Diagnosis not present

## 2015-06-04 DIAGNOSIS — Z48812 Encounter for surgical aftercare following surgery on the circulatory system: Secondary | ICD-10-CM | POA: Diagnosis not present

## 2015-06-04 DIAGNOSIS — E1165 Type 2 diabetes mellitus with hyperglycemia: Secondary | ICD-10-CM | POA: Diagnosis not present

## 2015-06-04 DIAGNOSIS — Z950 Presence of cardiac pacemaker: Secondary | ICD-10-CM | POA: Diagnosis not present

## 2015-06-05 DIAGNOSIS — M1991 Primary osteoarthritis, unspecified site: Secondary | ICD-10-CM | POA: Diagnosis not present

## 2015-06-05 DIAGNOSIS — J449 Chronic obstructive pulmonary disease, unspecified: Secondary | ICD-10-CM | POA: Diagnosis not present

## 2015-06-05 DIAGNOSIS — E1165 Type 2 diabetes mellitus with hyperglycemia: Secondary | ICD-10-CM | POA: Diagnosis not present

## 2015-06-05 DIAGNOSIS — Z951 Presence of aortocoronary bypass graft: Secondary | ICD-10-CM | POA: Diagnosis not present

## 2015-06-05 DIAGNOSIS — Z48812 Encounter for surgical aftercare following surgery on the circulatory system: Secondary | ICD-10-CM | POA: Diagnosis not present

## 2015-06-05 DIAGNOSIS — I4891 Unspecified atrial fibrillation: Secondary | ICD-10-CM | POA: Diagnosis not present

## 2015-06-05 DIAGNOSIS — I442 Atrioventricular block, complete: Secondary | ICD-10-CM | POA: Diagnosis not present

## 2015-06-05 DIAGNOSIS — I25118 Atherosclerotic heart disease of native coronary artery with other forms of angina pectoris: Secondary | ICD-10-CM | POA: Diagnosis not present

## 2015-06-05 DIAGNOSIS — Z950 Presence of cardiac pacemaker: Secondary | ICD-10-CM | POA: Diagnosis not present

## 2015-06-05 DIAGNOSIS — I10 Essential (primary) hypertension: Secondary | ICD-10-CM | POA: Diagnosis not present

## 2015-06-05 DIAGNOSIS — Z8673 Personal history of transient ischemic attack (TIA), and cerebral infarction without residual deficits: Secondary | ICD-10-CM | POA: Diagnosis not present

## 2015-06-09 DIAGNOSIS — I10 Essential (primary) hypertension: Secondary | ICD-10-CM | POA: Diagnosis not present

## 2015-06-09 DIAGNOSIS — Z48812 Encounter for surgical aftercare following surgery on the circulatory system: Secondary | ICD-10-CM | POA: Diagnosis not present

## 2015-06-09 DIAGNOSIS — I4891 Unspecified atrial fibrillation: Secondary | ICD-10-CM | POA: Diagnosis not present

## 2015-06-09 DIAGNOSIS — J449 Chronic obstructive pulmonary disease, unspecified: Secondary | ICD-10-CM | POA: Diagnosis not present

## 2015-06-09 DIAGNOSIS — I25118 Atherosclerotic heart disease of native coronary artery with other forms of angina pectoris: Secondary | ICD-10-CM | POA: Diagnosis not present

## 2015-06-09 DIAGNOSIS — Z8673 Personal history of transient ischemic attack (TIA), and cerebral infarction without residual deficits: Secondary | ICD-10-CM | POA: Diagnosis not present

## 2015-06-09 DIAGNOSIS — E1165 Type 2 diabetes mellitus with hyperglycemia: Secondary | ICD-10-CM | POA: Diagnosis not present

## 2015-06-09 DIAGNOSIS — M1991 Primary osteoarthritis, unspecified site: Secondary | ICD-10-CM | POA: Diagnosis not present

## 2015-06-09 DIAGNOSIS — I442 Atrioventricular block, complete: Secondary | ICD-10-CM | POA: Diagnosis not present

## 2015-06-09 DIAGNOSIS — Z950 Presence of cardiac pacemaker: Secondary | ICD-10-CM | POA: Diagnosis not present

## 2015-06-09 DIAGNOSIS — Z951 Presence of aortocoronary bypass graft: Secondary | ICD-10-CM | POA: Diagnosis not present

## 2015-06-11 DIAGNOSIS — Z950 Presence of cardiac pacemaker: Secondary | ICD-10-CM | POA: Diagnosis not present

## 2015-06-11 DIAGNOSIS — Z951 Presence of aortocoronary bypass graft: Secondary | ICD-10-CM | POA: Diagnosis not present

## 2015-06-11 DIAGNOSIS — I4891 Unspecified atrial fibrillation: Secondary | ICD-10-CM | POA: Diagnosis not present

## 2015-06-11 DIAGNOSIS — J449 Chronic obstructive pulmonary disease, unspecified: Secondary | ICD-10-CM | POA: Diagnosis not present

## 2015-06-11 DIAGNOSIS — I25118 Atherosclerotic heart disease of native coronary artery with other forms of angina pectoris: Secondary | ICD-10-CM | POA: Diagnosis not present

## 2015-06-11 DIAGNOSIS — M1991 Primary osteoarthritis, unspecified site: Secondary | ICD-10-CM | POA: Diagnosis not present

## 2015-06-11 DIAGNOSIS — I10 Essential (primary) hypertension: Secondary | ICD-10-CM | POA: Diagnosis not present

## 2015-06-11 DIAGNOSIS — Z8673 Personal history of transient ischemic attack (TIA), and cerebral infarction without residual deficits: Secondary | ICD-10-CM | POA: Diagnosis not present

## 2015-06-11 DIAGNOSIS — I442 Atrioventricular block, complete: Secondary | ICD-10-CM | POA: Diagnosis not present

## 2015-06-11 DIAGNOSIS — E1165 Type 2 diabetes mellitus with hyperglycemia: Secondary | ICD-10-CM | POA: Diagnosis not present

## 2015-06-11 DIAGNOSIS — Z48812 Encounter for surgical aftercare following surgery on the circulatory system: Secondary | ICD-10-CM | POA: Diagnosis not present

## 2015-06-12 DIAGNOSIS — Z951 Presence of aortocoronary bypass graft: Secondary | ICD-10-CM | POA: Diagnosis not present

## 2015-06-12 DIAGNOSIS — I4891 Unspecified atrial fibrillation: Secondary | ICD-10-CM | POA: Diagnosis not present

## 2015-06-12 DIAGNOSIS — E1165 Type 2 diabetes mellitus with hyperglycemia: Secondary | ICD-10-CM | POA: Diagnosis not present

## 2015-06-12 DIAGNOSIS — I25118 Atherosclerotic heart disease of native coronary artery with other forms of angina pectoris: Secondary | ICD-10-CM | POA: Diagnosis not present

## 2015-06-12 DIAGNOSIS — Z8673 Personal history of transient ischemic attack (TIA), and cerebral infarction without residual deficits: Secondary | ICD-10-CM | POA: Diagnosis not present

## 2015-06-12 DIAGNOSIS — J449 Chronic obstructive pulmonary disease, unspecified: Secondary | ICD-10-CM | POA: Diagnosis not present

## 2015-06-12 DIAGNOSIS — M1991 Primary osteoarthritis, unspecified site: Secondary | ICD-10-CM | POA: Diagnosis not present

## 2015-06-12 DIAGNOSIS — Z950 Presence of cardiac pacemaker: Secondary | ICD-10-CM | POA: Diagnosis not present

## 2015-06-12 DIAGNOSIS — Z48812 Encounter for surgical aftercare following surgery on the circulatory system: Secondary | ICD-10-CM | POA: Diagnosis not present

## 2015-06-12 DIAGNOSIS — I442 Atrioventricular block, complete: Secondary | ICD-10-CM | POA: Diagnosis not present

## 2015-06-12 DIAGNOSIS — I10 Essential (primary) hypertension: Secondary | ICD-10-CM | POA: Diagnosis not present

## 2015-06-13 DIAGNOSIS — Z48812 Encounter for surgical aftercare following surgery on the circulatory system: Secondary | ICD-10-CM | POA: Diagnosis not present

## 2015-06-13 DIAGNOSIS — I442 Atrioventricular block, complete: Secondary | ICD-10-CM | POA: Diagnosis not present

## 2015-06-13 DIAGNOSIS — Z8673 Personal history of transient ischemic attack (TIA), and cerebral infarction without residual deficits: Secondary | ICD-10-CM | POA: Diagnosis not present

## 2015-06-13 DIAGNOSIS — I4891 Unspecified atrial fibrillation: Secondary | ICD-10-CM | POA: Diagnosis not present

## 2015-06-13 DIAGNOSIS — M1991 Primary osteoarthritis, unspecified site: Secondary | ICD-10-CM | POA: Diagnosis not present

## 2015-06-13 DIAGNOSIS — Z951 Presence of aortocoronary bypass graft: Secondary | ICD-10-CM | POA: Diagnosis not present

## 2015-06-13 DIAGNOSIS — I25118 Atherosclerotic heart disease of native coronary artery with other forms of angina pectoris: Secondary | ICD-10-CM | POA: Diagnosis not present

## 2015-06-13 DIAGNOSIS — E1165 Type 2 diabetes mellitus with hyperglycemia: Secondary | ICD-10-CM | POA: Diagnosis not present

## 2015-06-13 DIAGNOSIS — Z950 Presence of cardiac pacemaker: Secondary | ICD-10-CM | POA: Diagnosis not present

## 2015-06-13 DIAGNOSIS — I10 Essential (primary) hypertension: Secondary | ICD-10-CM | POA: Diagnosis not present

## 2015-06-13 DIAGNOSIS — J449 Chronic obstructive pulmonary disease, unspecified: Secondary | ICD-10-CM | POA: Diagnosis not present

## 2015-06-16 DIAGNOSIS — M1991 Primary osteoarthritis, unspecified site: Secondary | ICD-10-CM | POA: Diagnosis not present

## 2015-06-16 DIAGNOSIS — I4891 Unspecified atrial fibrillation: Secondary | ICD-10-CM | POA: Diagnosis not present

## 2015-06-16 DIAGNOSIS — Z951 Presence of aortocoronary bypass graft: Secondary | ICD-10-CM | POA: Diagnosis not present

## 2015-06-16 DIAGNOSIS — Z48812 Encounter for surgical aftercare following surgery on the circulatory system: Secondary | ICD-10-CM | POA: Diagnosis not present

## 2015-06-16 DIAGNOSIS — Z8673 Personal history of transient ischemic attack (TIA), and cerebral infarction without residual deficits: Secondary | ICD-10-CM | POA: Diagnosis not present

## 2015-06-16 DIAGNOSIS — I25118 Atherosclerotic heart disease of native coronary artery with other forms of angina pectoris: Secondary | ICD-10-CM | POA: Diagnosis not present

## 2015-06-16 DIAGNOSIS — Z950 Presence of cardiac pacemaker: Secondary | ICD-10-CM | POA: Diagnosis not present

## 2015-06-16 DIAGNOSIS — I442 Atrioventricular block, complete: Secondary | ICD-10-CM | POA: Diagnosis not present

## 2015-06-16 DIAGNOSIS — E1165 Type 2 diabetes mellitus with hyperglycemia: Secondary | ICD-10-CM | POA: Diagnosis not present

## 2015-06-16 DIAGNOSIS — J449 Chronic obstructive pulmonary disease, unspecified: Secondary | ICD-10-CM | POA: Diagnosis not present

## 2015-06-16 DIAGNOSIS — I10 Essential (primary) hypertension: Secondary | ICD-10-CM | POA: Diagnosis not present

## 2015-06-19 DIAGNOSIS — J449 Chronic obstructive pulmonary disease, unspecified: Secondary | ICD-10-CM | POA: Diagnosis not present

## 2015-06-19 DIAGNOSIS — E1165 Type 2 diabetes mellitus with hyperglycemia: Secondary | ICD-10-CM | POA: Diagnosis not present

## 2015-06-19 DIAGNOSIS — M1991 Primary osteoarthritis, unspecified site: Secondary | ICD-10-CM | POA: Diagnosis not present

## 2015-06-19 DIAGNOSIS — I10 Essential (primary) hypertension: Secondary | ICD-10-CM | POA: Diagnosis not present

## 2015-06-19 DIAGNOSIS — I25118 Atherosclerotic heart disease of native coronary artery with other forms of angina pectoris: Secondary | ICD-10-CM | POA: Diagnosis not present

## 2015-06-19 DIAGNOSIS — I4891 Unspecified atrial fibrillation: Secondary | ICD-10-CM | POA: Diagnosis not present

## 2015-06-19 DIAGNOSIS — Z48812 Encounter for surgical aftercare following surgery on the circulatory system: Secondary | ICD-10-CM | POA: Diagnosis not present

## 2015-06-19 DIAGNOSIS — I442 Atrioventricular block, complete: Secondary | ICD-10-CM | POA: Diagnosis not present

## 2015-06-19 DIAGNOSIS — Z951 Presence of aortocoronary bypass graft: Secondary | ICD-10-CM | POA: Diagnosis not present

## 2015-06-19 DIAGNOSIS — Z950 Presence of cardiac pacemaker: Secondary | ICD-10-CM | POA: Diagnosis not present

## 2015-06-19 DIAGNOSIS — Z8673 Personal history of transient ischemic attack (TIA), and cerebral infarction without residual deficits: Secondary | ICD-10-CM | POA: Diagnosis not present

## 2015-06-20 DIAGNOSIS — I10 Essential (primary) hypertension: Secondary | ICD-10-CM | POA: Diagnosis not present

## 2015-06-20 DIAGNOSIS — I442 Atrioventricular block, complete: Secondary | ICD-10-CM | POA: Diagnosis not present

## 2015-06-20 DIAGNOSIS — Z8673 Personal history of transient ischemic attack (TIA), and cerebral infarction without residual deficits: Secondary | ICD-10-CM | POA: Diagnosis not present

## 2015-06-20 DIAGNOSIS — E1165 Type 2 diabetes mellitus with hyperglycemia: Secondary | ICD-10-CM | POA: Diagnosis not present

## 2015-06-20 DIAGNOSIS — I25118 Atherosclerotic heart disease of native coronary artery with other forms of angina pectoris: Secondary | ICD-10-CM | POA: Diagnosis not present

## 2015-06-20 DIAGNOSIS — Z48812 Encounter for surgical aftercare following surgery on the circulatory system: Secondary | ICD-10-CM | POA: Diagnosis not present

## 2015-06-20 DIAGNOSIS — M1991 Primary osteoarthritis, unspecified site: Secondary | ICD-10-CM | POA: Diagnosis not present

## 2015-06-20 DIAGNOSIS — I4891 Unspecified atrial fibrillation: Secondary | ICD-10-CM | POA: Diagnosis not present

## 2015-06-20 DIAGNOSIS — Z951 Presence of aortocoronary bypass graft: Secondary | ICD-10-CM | POA: Diagnosis not present

## 2015-06-20 DIAGNOSIS — Z950 Presence of cardiac pacemaker: Secondary | ICD-10-CM | POA: Diagnosis not present

## 2015-06-20 DIAGNOSIS — J449 Chronic obstructive pulmonary disease, unspecified: Secondary | ICD-10-CM | POA: Diagnosis not present

## 2015-06-24 DIAGNOSIS — E785 Hyperlipidemia, unspecified: Secondary | ICD-10-CM | POA: Diagnosis not present

## 2015-06-24 DIAGNOSIS — I442 Atrioventricular block, complete: Secondary | ICD-10-CM | POA: Diagnosis not present

## 2015-06-24 DIAGNOSIS — Z48812 Encounter for surgical aftercare following surgery on the circulatory system: Secondary | ICD-10-CM | POA: Diagnosis not present

## 2015-06-24 DIAGNOSIS — E1165 Type 2 diabetes mellitus with hyperglycemia: Secondary | ICD-10-CM | POA: Diagnosis not present

## 2015-06-24 DIAGNOSIS — E119 Type 2 diabetes mellitus without complications: Secondary | ICD-10-CM | POA: Diagnosis not present

## 2015-06-24 DIAGNOSIS — I10 Essential (primary) hypertension: Secondary | ICD-10-CM | POA: Diagnosis not present

## 2015-06-24 DIAGNOSIS — I2510 Atherosclerotic heart disease of native coronary artery without angina pectoris: Secondary | ICD-10-CM | POA: Diagnosis not present

## 2015-06-25 DIAGNOSIS — I4891 Unspecified atrial fibrillation: Secondary | ICD-10-CM | POA: Diagnosis not present

## 2015-06-25 DIAGNOSIS — I2510 Atherosclerotic heart disease of native coronary artery without angina pectoris: Secondary | ICD-10-CM | POA: Diagnosis not present

## 2015-06-25 DIAGNOSIS — Z48812 Encounter for surgical aftercare following surgery on the circulatory system: Secondary | ICD-10-CM | POA: Diagnosis not present

## 2015-06-25 DIAGNOSIS — E119 Type 2 diabetes mellitus without complications: Secondary | ICD-10-CM | POA: Diagnosis not present

## 2015-06-25 DIAGNOSIS — E1165 Type 2 diabetes mellitus with hyperglycemia: Secondary | ICD-10-CM | POA: Diagnosis not present

## 2015-06-25 DIAGNOSIS — Z950 Presence of cardiac pacemaker: Secondary | ICD-10-CM | POA: Insufficient documentation

## 2015-06-25 DIAGNOSIS — Z951 Presence of aortocoronary bypass graft: Secondary | ICD-10-CM | POA: Diagnosis not present

## 2015-06-25 DIAGNOSIS — I25118 Atherosclerotic heart disease of native coronary artery with other forms of angina pectoris: Secondary | ICD-10-CM | POA: Diagnosis not present

## 2015-06-25 DIAGNOSIS — Z8673 Personal history of transient ischemic attack (TIA), and cerebral infarction without residual deficits: Secondary | ICD-10-CM | POA: Diagnosis not present

## 2015-06-25 DIAGNOSIS — I442 Atrioventricular block, complete: Secondary | ICD-10-CM | POA: Diagnosis not present

## 2015-06-25 DIAGNOSIS — J449 Chronic obstructive pulmonary disease, unspecified: Secondary | ICD-10-CM | POA: Diagnosis not present

## 2015-06-25 DIAGNOSIS — M1991 Primary osteoarthritis, unspecified site: Secondary | ICD-10-CM | POA: Diagnosis not present

## 2015-06-25 DIAGNOSIS — I10 Essential (primary) hypertension: Secondary | ICD-10-CM | POA: Diagnosis not present

## 2015-07-02 DIAGNOSIS — I4891 Unspecified atrial fibrillation: Secondary | ICD-10-CM | POA: Diagnosis not present

## 2015-07-02 DIAGNOSIS — Z950 Presence of cardiac pacemaker: Secondary | ICD-10-CM | POA: Diagnosis not present

## 2015-07-02 DIAGNOSIS — I10 Essential (primary) hypertension: Secondary | ICD-10-CM | POA: Diagnosis not present

## 2015-07-02 DIAGNOSIS — Z951 Presence of aortocoronary bypass graft: Secondary | ICD-10-CM | POA: Diagnosis not present

## 2015-07-02 DIAGNOSIS — E1165 Type 2 diabetes mellitus with hyperglycemia: Secondary | ICD-10-CM | POA: Diagnosis not present

## 2015-07-02 DIAGNOSIS — I442 Atrioventricular block, complete: Secondary | ICD-10-CM | POA: Diagnosis not present

## 2015-07-02 DIAGNOSIS — Z8673 Personal history of transient ischemic attack (TIA), and cerebral infarction without residual deficits: Secondary | ICD-10-CM | POA: Diagnosis not present

## 2015-07-02 DIAGNOSIS — J449 Chronic obstructive pulmonary disease, unspecified: Secondary | ICD-10-CM | POA: Diagnosis not present

## 2015-07-02 DIAGNOSIS — I25118 Atherosclerotic heart disease of native coronary artery with other forms of angina pectoris: Secondary | ICD-10-CM | POA: Diagnosis not present

## 2015-07-02 DIAGNOSIS — Z48812 Encounter for surgical aftercare following surgery on the circulatory system: Secondary | ICD-10-CM | POA: Diagnosis not present

## 2015-07-02 DIAGNOSIS — M1991 Primary osteoarthritis, unspecified site: Secondary | ICD-10-CM | POA: Diagnosis not present

## 2015-07-24 ENCOUNTER — Other Ambulatory Visit: Payer: Self-pay | Admitting: *Deleted

## 2015-07-24 DIAGNOSIS — I6523 Occlusion and stenosis of bilateral carotid arteries: Secondary | ICD-10-CM

## 2015-07-30 ENCOUNTER — Encounter: Payer: Self-pay | Admitting: Vascular Surgery

## 2015-08-01 DIAGNOSIS — H2703 Aphakia, bilateral: Secondary | ICD-10-CM | POA: Diagnosis not present

## 2015-08-01 DIAGNOSIS — E119 Type 2 diabetes mellitus without complications: Secondary | ICD-10-CM | POA: Diagnosis not present

## 2015-08-05 ENCOUNTER — Ambulatory Visit (HOSPITAL_COMMUNITY): Payer: Medicare Other | Attending: Vascular Surgery

## 2015-08-05 ENCOUNTER — Ambulatory Visit: Payer: Medicaid Other | Admitting: Vascular Surgery

## 2015-08-30 DIAGNOSIS — Z950 Presence of cardiac pacemaker: Secondary | ICD-10-CM | POA: Diagnosis not present

## 2015-10-02 DIAGNOSIS — E119 Type 2 diabetes mellitus without complications: Secondary | ICD-10-CM | POA: Diagnosis not present

## 2015-10-02 DIAGNOSIS — I209 Angina pectoris, unspecified: Secondary | ICD-10-CM | POA: Diagnosis not present

## 2015-10-02 DIAGNOSIS — Z950 Presence of cardiac pacemaker: Secondary | ICD-10-CM | POA: Diagnosis not present

## 2015-10-02 DIAGNOSIS — I2510 Atherosclerotic heart disease of native coronary artery without angina pectoris: Secondary | ICD-10-CM | POA: Diagnosis not present

## 2015-10-02 DIAGNOSIS — Z951 Presence of aortocoronary bypass graft: Secondary | ICD-10-CM | POA: Diagnosis not present

## 2015-10-21 DIAGNOSIS — E119 Type 2 diabetes mellitus without complications: Secondary | ICD-10-CM | POA: Diagnosis not present

## 2015-10-21 DIAGNOSIS — I209 Angina pectoris, unspecified: Secondary | ICD-10-CM | POA: Diagnosis not present

## 2015-10-21 DIAGNOSIS — Z951 Presence of aortocoronary bypass graft: Secondary | ICD-10-CM | POA: Diagnosis not present

## 2015-10-21 DIAGNOSIS — Z950 Presence of cardiac pacemaker: Secondary | ICD-10-CM | POA: Diagnosis not present

## 2015-10-21 DIAGNOSIS — I2510 Atherosclerotic heart disease of native coronary artery without angina pectoris: Secondary | ICD-10-CM | POA: Diagnosis not present

## 2015-10-31 DIAGNOSIS — E119 Type 2 diabetes mellitus without complications: Secondary | ICD-10-CM | POA: Diagnosis not present

## 2015-10-31 DIAGNOSIS — F329 Major depressive disorder, single episode, unspecified: Secondary | ICD-10-CM | POA: Diagnosis not present

## 2015-10-31 DIAGNOSIS — G2581 Restless legs syndrome: Secondary | ICD-10-CM | POA: Diagnosis not present

## 2015-10-31 DIAGNOSIS — M4806 Spinal stenosis, lumbar region: Secondary | ICD-10-CM | POA: Diagnosis not present

## 2015-10-31 DIAGNOSIS — E785 Hyperlipidemia, unspecified: Secondary | ICD-10-CM | POA: Diagnosis not present

## 2015-10-31 DIAGNOSIS — I2510 Atherosclerotic heart disease of native coronary artery without angina pectoris: Secondary | ICD-10-CM | POA: Diagnosis not present

## 2015-10-31 DIAGNOSIS — Z6825 Body mass index (BMI) 25.0-25.9, adult: Secondary | ICD-10-CM | POA: Diagnosis not present

## 2015-10-31 DIAGNOSIS — E1140 Type 2 diabetes mellitus with diabetic neuropathy, unspecified: Secondary | ICD-10-CM | POA: Diagnosis not present

## 2015-11-07 DIAGNOSIS — I209 Angina pectoris, unspecified: Secondary | ICD-10-CM | POA: Diagnosis not present

## 2015-11-07 DIAGNOSIS — I2510 Atherosclerotic heart disease of native coronary artery without angina pectoris: Secondary | ICD-10-CM | POA: Diagnosis not present

## 2015-11-07 DIAGNOSIS — E119 Type 2 diabetes mellitus without complications: Secondary | ICD-10-CM | POA: Diagnosis not present

## 2015-11-07 DIAGNOSIS — Z950 Presence of cardiac pacemaker: Secondary | ICD-10-CM | POA: Diagnosis not present

## 2015-11-27 DIAGNOSIS — I442 Atrioventricular block, complete: Secondary | ICD-10-CM | POA: Diagnosis not present

## 2015-11-27 DIAGNOSIS — E119 Type 2 diabetes mellitus without complications: Secondary | ICD-10-CM | POA: Diagnosis not present

## 2015-11-27 DIAGNOSIS — I2510 Atherosclerotic heart disease of native coronary artery without angina pectoris: Secondary | ICD-10-CM | POA: Diagnosis not present

## 2015-11-27 DIAGNOSIS — Z950 Presence of cardiac pacemaker: Secondary | ICD-10-CM | POA: Diagnosis not present

## 2015-11-27 DIAGNOSIS — Z951 Presence of aortocoronary bypass graft: Secondary | ICD-10-CM | POA: Diagnosis not present

## 2015-11-27 DIAGNOSIS — I209 Angina pectoris, unspecified: Secondary | ICD-10-CM | POA: Diagnosis not present

## 2015-12-23 ENCOUNTER — Ambulatory Visit (HOSPITAL_COMMUNITY)
Admission: RE | Admit: 2015-12-23 | Discharge: 2015-12-23 | Disposition: A | Discharge status: Home / Self Care | Payer: Medicare Other | Source: Ambulatory Visit | Attending: Vascular Surgery | Admitting: Vascular Surgery

## 2015-12-23 DIAGNOSIS — I6523 Occlusion and stenosis of bilateral carotid arteries: Secondary | ICD-10-CM | POA: Diagnosis not present

## 2015-12-23 LAB — VAS US CAROTID
LCCADDIAS: -13 cm/s
LCCADSYS: -104 cm/s
LCCAPDIAS: 11 cm/s
LCCAPSYS: 74 cm/s
LEFT ECA DIAS: -4 cm/s
LEFT VERTEBRAL DIAS: -10 cm/s
Left ICA dist dias: -16 cm/s
Left ICA dist sys: -80 cm/s
RIGHT CCA MID DIAS: 12 cm/s
RIGHT ECA DIAS: -1 cm/s
RIGHT VERTEBRAL DIAS: -10 cm/s
Right CCA prox dias: 9 cm/s
Right CCA prox sys: 98 cm/s

## 2015-12-24 ENCOUNTER — Telehealth: Payer: Self-pay | Admitting: Vascular Surgery

## 2015-12-24 ENCOUNTER — Telehealth: Payer: Self-pay

## 2015-12-24 NOTE — Telephone Encounter (Signed)
Staff message sent to Dr. Randie Heinzain, re: need for f/u appt.

## 2015-12-24 NOTE — Telephone Encounter (Signed)
Lm on hm# informing pt that he would need to come in for his follow up appt.

## 2015-12-24 NOTE — Telephone Encounter (Signed)
-----   Message from Massie KluverKimberly A McKenzie sent at 12/23/2015  9:14 AM EST ----- Regarding: Carotid US results  This patient had a Carotid US today and is supposed to follow up on 12/1. The patient is concerned that he lives too far and would like to know if we could call him with the results of his ultrasound.

## 2015-12-24 NOTE — Telephone Encounter (Signed)
-----   Message from Phillips Odorarol S Pullins, RN sent at 12/24/2015  2:23 PM EST ----- Regarding: FW: f/u needed vs giving results per phone Please notify the pt. and request for him to plan on keeping his f/u appt. On 12/1; Dr. Randie Heinzain wants him to be seen in the office in conjunction with the Carotid duplex.  Thanks.  ----- Message ----- From: Maeola HarmanBrandon Christopher Cain, MD Sent: 12/24/2015   1:18 PM To: Smitty Pluckarol S Pullins, RN Subject: RE: f/u needed vs giving results per phone     Sounds like a bad precedent to set. He should see someone, likely Rosalita ChessmanSuzanne.   ----- Message ----- From: Phillips Odorarol S Pullins, RN Sent: 12/24/2015  12:51 PM To: Maeola HarmanBrandon Christopher Cain, MD Subject: f/u needed vs giving results per phone         This pt. Had right CEA 06/2014 per Dr. Hart RochesterLawson; he did not show up for his f/u appt. in 07/2015. (last seen in office in 01/2015)  He is requesting to have the duplex results called to him (done 12/23/15), as he feels he has to drive too far, to come to appt. 01/09/16.  Is this okay, since he missed his f/u in June?  Please advise.

## 2015-12-24 NOTE — Telephone Encounter (Signed)
RE: f/u needed vs giving results per phone  Received: Today  Message Contents  Maeola HarmanBrandon Christopher Cain, MD  Phillips Odorarol S Danyl Deems, RN        Sounds like a bad precedent to set. He should see someone, likely [place]   Pt. Will be notified per scheduling staff to keep his appt. On 01/09/16 as previously scheduled with Dr. Randie Heinzain.

## 2016-01-07 ENCOUNTER — Encounter: Payer: Self-pay | Admitting: Vascular Surgery

## 2016-01-09 ENCOUNTER — Encounter: Payer: Self-pay | Admitting: Vascular Surgery

## 2016-01-09 ENCOUNTER — Ambulatory Visit (INDEPENDENT_AMBULATORY_CARE_PROVIDER_SITE_OTHER): Payer: Medicare Other | Admitting: Vascular Surgery

## 2016-01-09 VITALS — BP 134/66 | HR 60 | Temp 97.2°F | Resp 16 | Ht 72.0 in | Wt 190.0 lb

## 2016-01-09 DIAGNOSIS — I6523 Occlusion and stenosis of bilateral carotid arteries: Secondary | ICD-10-CM | POA: Diagnosis not present

## 2016-01-09 DIAGNOSIS — Z48812 Encounter for surgical aftercare following surgery on the circulatory system: Secondary | ICD-10-CM | POA: Diagnosis not present

## 2016-01-09 NOTE — Progress Notes (Signed)
Patient ID: Gary SavoyBilly J Lamoreaux, male   DOB: 1933/04/05, 82 y.o.   MRN: [id number]  Reason for Consult: Carotid (f/u)   Referred by Guadalupe MapleGage, John F., MD  Subjective:     HPI:  Gary Kirby is a 82 y.o. male follows up from right carotid endarterectomy performed by Dr. Hart RochesterLawson in May 2016. He did have a remote history of a stroke prior to the surgery but no complications and has had no neurologic symptoms since. Specifically he denies TIA stroke or amaurosis. He does have limitation to his walking which is a left knee problem. He has never been a smoker. He remains on aspirin and statin daily.  Past Medical History:  Diagnosis Date  . Anemia   . Diabetes mellitus without complication (HCC)    Type 2  . HOH (hard of hearing)   . Hyperlipidemia   . Hypertension   . Shortness of breath dyspnea    with ambulation  . Stroke [facility](HCC)    Family History  Problem Relation Age of Onset  . Heart disease Mother   . Heart attack Father   . Cancer Brother   . Diabetes Brother   . Heart disease Brother   . Hyperlipidemia Brother    Past Surgical History:  Procedure Laterality Date  . BACK SURGERY    . CARDIAC CATHETERIZATION    . CATARACT EXTRACTION    . CHOLECYSTECTOMY    . COLONOSCOPY    . CORONARY ARTERY BYPASS GRAFT    . ENDARTERECTOMY Right 06/28/2014   Procedure: RIGHT CAROTID ARTERY ENDARTERECTOMY ;  Surgeon: Pryor OchoaJames D Lawson, MD;  [facility];  Service: Vascular;  Laterality: Right;  . PATCH ANGIOPLASTY Right 06/28/2014   Procedure: WITH DACRON PATCH ANGIOPLASTY;  Surgeon: Pryor OchoaJames D Lawson, MD;  [facility];  Service: Vascular;  Laterality: Right;  . ROTATOR CUFF REPAIR Right     Short Social History:  Social History  Substance Use Topics  . Smoking status: Never Smoker  . Smokeless tobacco: Current User    Types: Snuff  . Alcohol use No    No Known Allergies  Current Outpatient Prescriptions  Medication Sig Dispense Refill  . acetaminophen (TYLENOL) 500 MG tablet  Take 500 mg by mouth every 6 (six) hours as needed for mild pain or moderate pain.    Marland Kitchen. amLODipine (NORVASC) 5 MG tablet Take 5 mg by mouth daily.  12  . aspirin 325 MG tablet Take 325 mg by mouth daily.    . citalopram (CELEXA) 20 MG tablet Take 20 mg by mouth daily.     . folic acid (FOLVITE) 800 MCG tablet Take 800 mcg by mouth daily.    . hydrochlorothiazide (HYDRODIURIL) 12.5 MG tablet Take 12.5 mg by mouth daily.    . isosorbide mononitrate (IMDUR) 60 MG 24 hr tablet Take 60 mg by mouth daily.  3  . JANUVIA 100 MG tablet Take 100 mg by mouth daily.    . meclizine (ANTIVERT) 25 MG tablet Take 25 mg by mouth 3 (three) times daily as needed for dizziness.   3  . metFORMIN (GLUCOPHAGE) 1000 MG tablet Take 1 tablet by mouth 2 (two) times daily.    . pramipexole (MIRAPEX) 0.5 MG tablet Take 0.5 mg by mouth daily as needed (restless legs syndrome).   12  . pravastatin (PRAVACHOL) 80 MG tablet Take 80 mg by mouth daily.  12  . quinapril (ACCUPRIL) 40 MG tablet Take 40 mg by mouth daily.    .Marland Kitchen  HYDROcodone-acetaminophen (NORCO) 7.5-325 MG per tablet Take 1 tablet by mouth daily as needed for moderate pain or severe pain.     Marland Kitchen. HYDROcodone-acetaminophen (NORCO) 7.5-325 MG per tablet Take 1-2 tablets by mouth every 4 (four) hours as needed for moderate pain or severe pain. (Patient not taking: Reported on 01/09/2016) 30 tablet 0   No current facility-administered medications for this visit.     Review of Systems  Constitutional:  Constitutional negative. HENT: HENT negative.  Eyes: Eyes negative.  Respiratory: Respiratory negative.  Cardiovascular: Cardiovascular negative.  GI: Gastrointestinal negative.  GU: Genitourinary negative. Musculoskeletal: Positive for joint pain.  Skin: Skin negative.  Neurological: Neurological negative. Hematologic: Hematologic/lymphatic negative.        Objective:  Objective   Vitals:   01/09/16 0840 01/09/16 0842 01/09/16 0844  BP: (!) 199/79 (!) 183/77  134/66  Pulse: 60 60 60  Resp: 16    Temp: 97.2 F (36.2 C)    SpO2: 100%    Weight: 190 lb (86.2 kg)    Height: 6' (1.829 m)     Body mass index is 25.77 kg/m.  Physical Exam  Constitutional: He is oriented to person, place, and time. He appears well-developed.  HENT:  Head: Atraumatic.  Eyes: EOM are normal.  Neck: Normal range of motion.  Cardiovascular: Normal rate.   Pulmonary/Chest: Effort normal.  Abdominal: Soft.  Musculoskeletal: Normal range of motion. He exhibits no edema.  Neurological: He is alert and oriented to person, place, and time.  Skin: Skin is warm and dry.  Psychiatric: He has a normal mood and affect. His behavior is normal. Judgment and thought content normal.    Data:      Assessment/Plan:     82year-old white male status post right carotid endarterectomy returns for now 80 months follow-up. He has not had neurologic symptoms and he has no evidence of restenosis in a stable stenosis on the left. Have counseled him on the signs and symptoms of stroke. He will continue aspirin and statin and follow up in 1 year.     Maeola HarmanBrandon Christopher Philopateer Strine MD [facility]

## 2016-01-09 NOTE — Addendum Note (Signed)
Addended by: Burton ApleyPETTY, Brittnee Gaetano A on: 01/09/2016 09:36 AM   Modules accepted: Orders

## 2016-01-21 DIAGNOSIS — Z950 Presence of cardiac pacemaker: Secondary | ICD-10-CM | POA: Diagnosis not present

## 2016-03-16 ENCOUNTER — Encounter: Payer: Self-pay | Admitting: Cardiology

## 2016-03-31 DIAGNOSIS — E119 Type 2 diabetes mellitus without complications: Secondary | ICD-10-CM | POA: Diagnosis not present

## 2016-03-31 DIAGNOSIS — Z79899 Other long term (current) drug therapy: Secondary | ICD-10-CM | POA: Diagnosis not present

## 2016-03-31 DIAGNOSIS — G4700 Insomnia, unspecified: Secondary | ICD-10-CM | POA: Diagnosis not present

## 2016-03-31 DIAGNOSIS — I619 Nontraumatic intracerebral hemorrhage, unspecified: Secondary | ICD-10-CM | POA: Diagnosis not present

## 2016-03-31 DIAGNOSIS — M48061 Spinal stenosis, lumbar region without neurogenic claudication: Secondary | ICD-10-CM | POA: Diagnosis not present

## 2016-03-31 DIAGNOSIS — I2510 Atherosclerotic heart disease of native coronary artery without angina pectoris: Secondary | ICD-10-CM | POA: Diagnosis not present

## 2016-03-31 DIAGNOSIS — E785 Hyperlipidemia, unspecified: Secondary | ICD-10-CM | POA: Diagnosis not present

## 2016-03-31 DIAGNOSIS — F329 Major depressive disorder, single episode, unspecified: Secondary | ICD-10-CM | POA: Diagnosis not present

## 2016-03-31 DIAGNOSIS — E1140 Type 2 diabetes mellitus with diabetic neuropathy, unspecified: Secondary | ICD-10-CM | POA: Diagnosis not present

## 2016-03-31 DIAGNOSIS — D649 Anemia, unspecified: Secondary | ICD-10-CM | POA: Diagnosis not present

## 2016-03-31 DIAGNOSIS — E538 Deficiency of other specified B group vitamins: Secondary | ICD-10-CM | POA: Diagnosis not present

## 2016-03-31 DIAGNOSIS — G2581 Restless legs syndrome: Secondary | ICD-10-CM | POA: Diagnosis not present

## 2016-06-30 DIAGNOSIS — Z950 Presence of cardiac pacemaker: Secondary | ICD-10-CM | POA: Diagnosis not present

## 2016-06-30 DIAGNOSIS — E119 Type 2 diabetes mellitus without complications: Secondary | ICD-10-CM | POA: Diagnosis not present

## 2016-06-30 DIAGNOSIS — I442 Atrioventricular block, complete: Secondary | ICD-10-CM | POA: Diagnosis not present

## 2016-06-30 DIAGNOSIS — I2510 Atherosclerotic heart disease of native coronary artery without angina pectoris: Secondary | ICD-10-CM | POA: Diagnosis not present

## 2016-06-30 DIAGNOSIS — Z951 Presence of aortocoronary bypass graft: Secondary | ICD-10-CM | POA: Diagnosis not present

## 2016-06-30 DIAGNOSIS — Z794 Long term (current) use of insulin: Secondary | ICD-10-CM | POA: Diagnosis not present

## 2016-07-13 DIAGNOSIS — I442 Atrioventricular block, complete: Secondary | ICD-10-CM | POA: Diagnosis not present

## 2016-07-13 DIAGNOSIS — I2510 Atherosclerotic heart disease of native coronary artery without angina pectoris: Secondary | ICD-10-CM | POA: Diagnosis not present

## 2016-07-13 DIAGNOSIS — Z950 Presence of cardiac pacemaker: Secondary | ICD-10-CM | POA: Diagnosis not present

## 2016-07-13 DIAGNOSIS — Z794 Long term (current) use of insulin: Secondary | ICD-10-CM | POA: Diagnosis not present

## 2016-07-13 DIAGNOSIS — E119 Type 2 diabetes mellitus without complications: Secondary | ICD-10-CM | POA: Diagnosis not present

## 2016-07-13 DIAGNOSIS — Z951 Presence of aortocoronary bypass graft: Secondary | ICD-10-CM | POA: Diagnosis not present

## 2016-07-22 DIAGNOSIS — E785 Hyperlipidemia, unspecified: Secondary | ICD-10-CM | POA: Diagnosis not present

## 2016-07-22 DIAGNOSIS — G4700 Insomnia, unspecified: Secondary | ICD-10-CM | POA: Diagnosis not present

## 2016-07-22 DIAGNOSIS — I10 Essential (primary) hypertension: Secondary | ICD-10-CM | POA: Diagnosis not present

## 2016-07-22 DIAGNOSIS — I2510 Atherosclerotic heart disease of native coronary artery without angina pectoris: Secondary | ICD-10-CM | POA: Diagnosis not present

## 2016-07-22 DIAGNOSIS — D649 Anemia, unspecified: Secondary | ICD-10-CM | POA: Diagnosis not present

## 2016-07-22 DIAGNOSIS — Z1389 Encounter for screening for other disorder: Secondary | ICD-10-CM | POA: Diagnosis not present

## 2016-07-22 DIAGNOSIS — E119 Type 2 diabetes mellitus without complications: Secondary | ICD-10-CM | POA: Diagnosis not present

## 2016-07-22 DIAGNOSIS — M818 Other osteoporosis without current pathological fracture: Secondary | ICD-10-CM | POA: Diagnosis not present

## 2016-07-22 DIAGNOSIS — Z6826 Body mass index (BMI) 26.0-26.9, adult: Secondary | ICD-10-CM | POA: Diagnosis not present

## 2016-07-22 DIAGNOSIS — E663 Overweight: Secondary | ICD-10-CM | POA: Diagnosis not present

## 2016-07-22 DIAGNOSIS — E1140 Type 2 diabetes mellitus with diabetic neuropathy, unspecified: Secondary | ICD-10-CM | POA: Diagnosis not present

## 2016-07-22 DIAGNOSIS — M48061 Spinal stenosis, lumbar region without neurogenic claudication: Secondary | ICD-10-CM | POA: Diagnosis not present

## 2016-07-22 DIAGNOSIS — I619 Nontraumatic intracerebral hemorrhage, unspecified: Secondary | ICD-10-CM | POA: Diagnosis not present

## 2016-07-22 DIAGNOSIS — Z79899 Other long term (current) drug therapy: Secondary | ICD-10-CM | POA: Diagnosis not present

## 2016-07-22 DIAGNOSIS — Z9181 History of falling: Secondary | ICD-10-CM | POA: Diagnosis not present

## 2016-07-29 DIAGNOSIS — R29898 Other symptoms and signs involving the musculoskeletal system: Secondary | ICD-10-CM | POA: Diagnosis not present

## 2016-07-29 DIAGNOSIS — Z6825 Body mass index (BMI) 25.0-25.9, adult: Secondary | ICD-10-CM | POA: Diagnosis not present

## 2016-07-29 DIAGNOSIS — M1990 Unspecified osteoarthritis, unspecified site: Secondary | ICD-10-CM | POA: Diagnosis not present

## 2016-08-12 DIAGNOSIS — I10 Essential (primary) hypertension: Secondary | ICD-10-CM | POA: Diagnosis not present

## 2016-08-12 DIAGNOSIS — N189 Chronic kidney disease, unspecified: Secondary | ICD-10-CM | POA: Diagnosis not present

## 2016-08-12 DIAGNOSIS — D649 Anemia, unspecified: Secondary | ICD-10-CM | POA: Diagnosis not present

## 2016-08-24 DIAGNOSIS — Z950 Presence of cardiac pacemaker: Secondary | ICD-10-CM | POA: Diagnosis not present

## 2016-08-24 DIAGNOSIS — R531 Weakness: Secondary | ICD-10-CM | POA: Diagnosis not present

## 2016-08-24 DIAGNOSIS — D649 Anemia, unspecified: Secondary | ICD-10-CM | POA: Diagnosis not present

## 2016-08-26 DIAGNOSIS — D631 Anemia in chronic kidney disease: Secondary | ICD-10-CM | POA: Diagnosis not present

## 2016-08-26 DIAGNOSIS — N189 Chronic kidney disease, unspecified: Secondary | ICD-10-CM | POA: Diagnosis not present

## 2016-08-27 DIAGNOSIS — N189 Chronic kidney disease, unspecified: Secondary | ICD-10-CM | POA: Diagnosis not present

## 2016-08-27 DIAGNOSIS — D631 Anemia in chronic kidney disease: Secondary | ICD-10-CM | POA: Diagnosis not present

## 2016-10-08 DIAGNOSIS — D631 Anemia in chronic kidney disease: Secondary | ICD-10-CM | POA: Diagnosis not present

## 2016-10-08 DIAGNOSIS — N189 Chronic kidney disease, unspecified: Secondary | ICD-10-CM | POA: Diagnosis not present

## 2016-10-25 DIAGNOSIS — F329 Major depressive disorder, single episode, unspecified: Secondary | ICD-10-CM | POA: Diagnosis not present

## 2016-10-25 DIAGNOSIS — M48061 Spinal stenosis, lumbar region without neurogenic claudication: Secondary | ICD-10-CM | POA: Diagnosis not present

## 2016-10-25 DIAGNOSIS — M5416 Radiculopathy, lumbar region: Secondary | ICD-10-CM | POA: Diagnosis not present

## 2016-10-25 DIAGNOSIS — I2510 Atherosclerotic heart disease of native coronary artery without angina pectoris: Secondary | ICD-10-CM | POA: Diagnosis not present

## 2016-10-25 DIAGNOSIS — E119 Type 2 diabetes mellitus without complications: Secondary | ICD-10-CM | POA: Diagnosis not present

## 2016-10-25 DIAGNOSIS — Z6825 Body mass index (BMI) 25.0-25.9, adult: Secondary | ICD-10-CM | POA: Diagnosis not present

## 2016-10-25 DIAGNOSIS — G2581 Restless legs syndrome: Secondary | ICD-10-CM | POA: Diagnosis not present

## 2016-10-25 DIAGNOSIS — E785 Hyperlipidemia, unspecified: Secondary | ICD-10-CM | POA: Diagnosis not present

## 2016-10-25 DIAGNOSIS — M1990 Unspecified osteoarthritis, unspecified site: Secondary | ICD-10-CM | POA: Diagnosis not present

## 2016-10-25 DIAGNOSIS — E1140 Type 2 diabetes mellitus with diabetic neuropathy, unspecified: Secondary | ICD-10-CM | POA: Diagnosis not present

## 2016-10-25 DIAGNOSIS — R110 Nausea: Secondary | ICD-10-CM | POA: Diagnosis not present

## 2016-11-18 DIAGNOSIS — D631 Anemia in chronic kidney disease: Secondary | ICD-10-CM | POA: Diagnosis not present

## 2016-11-18 DIAGNOSIS — N189 Chronic kidney disease, unspecified: Secondary | ICD-10-CM | POA: Diagnosis not present

## 2017-01-12 ENCOUNTER — Ambulatory Visit (INDEPENDENT_AMBULATORY_CARE_PROVIDER_SITE_OTHER): Payer: Medicare Other | Admitting: Vascular Surgery

## 2017-01-12 ENCOUNTER — Ambulatory Visit (HOSPITAL_COMMUNITY)
Admission: RE | Admit: 2017-01-12 | Discharge: 2017-01-12 | Disposition: A | Discharge status: Home / Self Care | Payer: Medicare Other | Source: Ambulatory Visit | Attending: Vascular Surgery | Admitting: Vascular Surgery

## 2017-01-12 ENCOUNTER — Other Ambulatory Visit: Payer: Self-pay

## 2017-01-12 ENCOUNTER — Encounter: Payer: Self-pay | Admitting: Vascular Surgery

## 2017-01-12 ENCOUNTER — Ambulatory Visit: Payer: Medicare Other | Admitting: Vascular Surgery

## 2017-01-12 VITALS — BP 121/62 | HR 60 | Temp 97.4°F | Resp 18 | Ht 73.0 in | Wt 197.0 lb

## 2017-01-12 DIAGNOSIS — I6521 Occlusion and stenosis of right carotid artery: Secondary | ICD-10-CM

## 2017-01-12 DIAGNOSIS — I6522 Occlusion and stenosis of left carotid artery: Secondary | ICD-10-CM | POA: Insufficient documentation

## 2017-01-12 DIAGNOSIS — Z48812 Encounter for surgical aftercare following surgery on the circulatory system: Secondary | ICD-10-CM | POA: Diagnosis not present

## 2017-01-12 DIAGNOSIS — I6523 Occlusion and stenosis of bilateral carotid arteries: Secondary | ICD-10-CM | POA: Diagnosis present

## 2017-01-12 LAB — VAS US CAROTID
LEFT ECA DIAS: 0 cm/s
LEFT VERTEBRAL DIAS: 14 cm/s
LICAPSYS: 183 cm/s
Left CCA dist dias: -17 cm/s
Left CCA dist sys: -95 cm/s
Left CCA prox dias: 13 cm/s
Left CCA prox sys: 86 cm/s
Left ICA dist dias: -24 cm/s
Left ICA dist sys: -107 cm/s
Left ICA prox dias: 35 cm/s
RIGHT CCA MID DIAS: -15 cm/s
RIGHT ECA DIAS: 0 cm/s
RIGHT VERTEBRAL DIAS: -13 cm/s
Right CCA prox dias: 10 cm/s
Right CCA prox sys: 86 cm/s
Right cca dist sys: -98 cm/s

## 2017-01-12 NOTE — Progress Notes (Signed)
Patient ID: Gary SavoyBilly J Wassmer, male   DOB: 1933/05/20, 83 y.o.   MRN: [id number]  Reason for Consult: Carotid (1 year f/u)   Referred by Guadalupe MapleGage, John F., MD  Subjective:     HPI:  Gary Kirby is a 83 y.o. male and remote history with history of a right carotid endarterectomy and TIA.  He presents for one-year follow-up with repeat carotid duplex.  He has not had any further TIA he did not does not have stroke or amaurosis.  He is neurologically intact.  He is able to walk without limitation.  He has never been a smoker.  Risk factors for vascular disease include diabetes, hyperlipidemia, hypertension.  He has no complaint related to today's visit.  Past Medical History:  Diagnosis Date  . Anemia   . Diabetes mellitus without complication (HCC)    Type 2  . HOH (hard of hearing)   . Hyperlipidemia   . Hypertension   . Shortness of breath dyspnea    with ambulation  . Stroke [facility](HCC)    Family History  Problem Relation Age of Onset  . Heart disease Mother   . Heart attack Father   . Cancer Brother   . Diabetes Brother   . Heart disease Brother   . Hyperlipidemia Brother    Past Surgical History:  Procedure Laterality Date  . BACK SURGERY    . CARDIAC CATHETERIZATION    . CATARACT EXTRACTION    . CHOLECYSTECTOMY    . COLONOSCOPY    . CORONARY ARTERY BYPASS GRAFT    . ENDARTERECTOMY Right 06/28/2014   Procedure: RIGHT CAROTID ARTERY ENDARTERECTOMY ;  Surgeon: Pryor OchoaJames D Lawson, MD;  [facility];  Service: Vascular;  Laterality: Right;  . PATCH ANGIOPLASTY Right 06/28/2014   Procedure: WITH DACRON PATCH ANGIOPLASTY;  Surgeon: Pryor OchoaJames D Lawson, MD;  [facility];  Service: Vascular;  Laterality: Right;  . ROTATOR CUFF REPAIR Right     Short Social History:  Social History   Tobacco Use  . Smoking status: Never Smoker  . Smokeless tobacco: Current User    Types: Snuff  Substance Use Topics  . Alcohol use: No    Alcohol/week: 0.0 oz    No Known  Allergies  Current Outpatient Medications  Medication Sig Dispense Refill  . acetaminophen (TYLENOL) 500 MG tablet Take 500 mg by mouth every 6 (six) hours as needed for mild pain or moderate pain.    Marland Kitchen. amLODipine (NORVASC) 10 MG tablet Take 10 mg by mouth.    Marland Kitchen. aspirin 325 MG tablet Take 325 mg by mouth daily.    . citalopram (CELEXA) 20 MG tablet Take 20 mg by mouth daily.     . DULoxetine (CYMBALTA) 30 MG capsule Take by mouth daily.  3  . folic acid (FOLVITE) 800 MCG tablet Take 800 mcg by mouth daily.    . hydrochlorothiazide (HYDRODIURIL) 12.5 MG tablet Take 12.5 mg by mouth daily.    . isosorbide mononitrate (IMDUR) 60 MG 24 hr tablet Take 60 mg by mouth daily.  3  . JANUVIA 100 MG tablet Take 100 mg by mouth daily.    . meclizine (ANTIVERT) 25 MG tablet Take 25 mg by mouth 3 (three) times daily as needed for dizziness.   3  . metFORMIN (GLUCOPHAGE) 1000 MG tablet Take 1 tablet by mouth 2 (two) times daily.    . metoprolol succinate (TOPROL-XL) 100 MG 24 hr tablet     . mirtazapine (REMERON) 15  MG tablet TAKE 1 (ONE) TABLET BY MOUTH EVERYNIGHT AS NEEDED FOR SLEEP  12  . ondansetron (ZOFRAN-ODT) 4 MG disintegrating tablet DISSOLVE 1 (ONE) TABLET ON TONGUE EVERY FOUR-SIX HOURS AS NEEDED  3  . pravastatin (PRAVACHOL) 80 MG tablet Take 80 mg by mouth daily.  12  . quinapril (ACCUPRIL) 40 MG tablet Take 40 mg by mouth daily.    Marland Kitchen. rOPINIRole (REQUIP) 1 MG tablet TAKE 1-4 TABLET BY MOUTH EVERYNIGHT AT BEDTIME  5  . SitaGLIPtin-MetFORMIN HCl 50-1000 MG TB24 Take by mouth.    . traMADol (ULTRAM) 50 MG tablet Take 50 mg by mouth 4 (four) times daily.  5  . Calcium Carbonate-Vitamin D 600-400 MG-UNIT tablet Take by mouth.    . DOCOSAHEXAENOIC ACID PO Take 1,200 mg by mouth.    . gabapentin (NEURONTIN) 300 MG capsule     . HYDROcodone-acetaminophen (NORCO) 7.5-325 MG per tablet Take 1 tablet by mouth daily as needed for moderate pain or severe pain.     Marland Kitchen. HYDROcodone-acetaminophen (NORCO)  7.5-325 MG per tablet Take 1-2 tablets by mouth every 4 (four) hours as needed for moderate pain or severe pain. (Patient not taking: Reported on 01/09/2016) 30 tablet 0  . oxyCODONE (OXY IR/ROXICODONE) 5 MG immediate release tablet Take 5 mg by mouth.    . pramipexole (MIRAPEX) 0.5 MG tablet Take 0.5 mg by mouth daily as needed (restless legs syndrome).   12   No current facility-administered medications for this visit.     Review of Systems  Constitutional:  Constitutional negative. HENT: HENT negative.  Eyes: Eyes negative.  Respiratory: Respiratory negative.  Cardiovascular: Cardiovascular negative.  GI: Gastrointestinal negative.  Musculoskeletal: Positive for leg pain and joint pain.  Neurological: Neurological negative. Hematologic: Hematologic/lymphatic negative.  Psychiatric: Psychiatric negative.        Objective:  Objective   Vitals:   01/12/17 1020 01/12/17 1024  BP: (!) 128/57 121/62  Pulse: 60 60  Resp: 18   Temp: (!) 97.4 F (36.3 C)   TempSrc: Oral   SpO2: 96%   Weight: 197 lb (89.4 kg)   Height: 6\' 1"  (1.854 m)    Body mass index is 25.99 kg/m.  Physical Exam  Constitutional: He is oriented to person, place, and time. He appears well-developed.  HENT:  Head: Normocephalic.  Eyes: Pupils are equal, round, and reactive to light.  Neck: Normal range of motion.  Cardiovascular: Normal rate.  Abdominal: Soft. He exhibits no mass.  Musculoskeletal: Normal range of motion. He exhibits no edema.  Neurological: He is alert and oriented to person, place, and time.  Skin: Skin is warm and dry.  Psychiatric: He has a normal mood and affect. Judgment and thought content normal.    Data: I have independently interpreted his carotid duplex which demonstrates patent right carotid endarterectomy site with 1-39% ICA stenosis on the left.     Assessment/Plan:     83 year old male follows up for routine evaluation from previous right carotid endarterectomy.  He  is doing very well without any further neurologic events and remains on aspirin.  He will follow-up in 1 year with repeat duplex.     Maeola HarmanBrandon Christopher Alyrica Thurow MD [facility]

## 2017-01-25 DIAGNOSIS — Z950 Presence of cardiac pacemaker: Secondary | ICD-10-CM | POA: Diagnosis not present

## 2017-01-31 NOTE — Addendum Note (Signed)
Addended by: Loredana Medellin A on: 01/31/2017 11:20 AM   Modules accepted: Orders  

## 2017-02-14 DIAGNOSIS — G2581 Restless legs syndrome: Secondary | ICD-10-CM | POA: Diagnosis not present

## 2017-02-14 DIAGNOSIS — N189 Chronic kidney disease, unspecified: Secondary | ICD-10-CM | POA: Diagnosis not present

## 2017-02-14 DIAGNOSIS — D631 Anemia in chronic kidney disease: Secondary | ICD-10-CM | POA: Diagnosis not present

## 2017-03-30 DIAGNOSIS — E785 Hyperlipidemia, unspecified: Secondary | ICD-10-CM | POA: Diagnosis not present

## 2017-03-30 DIAGNOSIS — I639 Cerebral infarction, unspecified: Secondary | ICD-10-CM | POA: Diagnosis not present

## 2017-03-30 DIAGNOSIS — M818 Other osteoporosis without current pathological fracture: Secondary | ICD-10-CM | POA: Diagnosis not present

## 2017-03-30 DIAGNOSIS — M25562 Pain in left knee: Secondary | ICD-10-CM | POA: Diagnosis not present

## 2017-03-30 DIAGNOSIS — I209 Angina pectoris, unspecified: Secondary | ICD-10-CM | POA: Diagnosis not present

## 2017-03-30 DIAGNOSIS — Z79899 Other long term (current) drug therapy: Secondary | ICD-10-CM | POA: Diagnosis not present

## 2017-03-30 DIAGNOSIS — E663 Overweight: Secondary | ICD-10-CM | POA: Diagnosis not present

## 2017-03-30 DIAGNOSIS — E119 Type 2 diabetes mellitus without complications: Secondary | ICD-10-CM | POA: Diagnosis not present

## 2017-03-30 DIAGNOSIS — G2581 Restless legs syndrome: Secondary | ICD-10-CM | POA: Diagnosis not present

## 2017-03-30 DIAGNOSIS — E1140 Type 2 diabetes mellitus with diabetic neuropathy, unspecified: Secondary | ICD-10-CM | POA: Diagnosis not present

## 2017-03-30 DIAGNOSIS — M1990 Unspecified osteoarthritis, unspecified site: Secondary | ICD-10-CM | POA: Diagnosis not present

## 2017-03-30 DIAGNOSIS — I2510 Atherosclerotic heart disease of native coronary artery without angina pectoris: Secondary | ICD-10-CM | POA: Diagnosis not present

## 2017-03-30 DIAGNOSIS — Z6825 Body mass index (BMI) 25.0-25.9, adult: Secondary | ICD-10-CM | POA: Diagnosis not present

## 2017-03-30 DIAGNOSIS — M5416 Radiculopathy, lumbar region: Secondary | ICD-10-CM | POA: Diagnosis not present

## 2017-04-01 ENCOUNTER — Ambulatory Visit (INDEPENDENT_AMBULATORY_CARE_PROVIDER_SITE_OTHER): Payer: Medicare Other | Admitting: Cardiology

## 2017-04-01 ENCOUNTER — Encounter: Payer: Self-pay | Admitting: Cardiology

## 2017-04-01 VITALS — BP 126/62 | HR 66 | Ht 73.0 in | Wt 195.1 lb

## 2017-04-01 DIAGNOSIS — I2510 Atherosclerotic heart disease of native coronary artery without angina pectoris: Secondary | ICD-10-CM | POA: Diagnosis not present

## 2017-04-01 DIAGNOSIS — E119 Type 2 diabetes mellitus without complications: Secondary | ICD-10-CM | POA: Diagnosis not present

## 2017-04-01 DIAGNOSIS — Z951 Presence of aortocoronary bypass graft: Secondary | ICD-10-CM

## 2017-04-01 DIAGNOSIS — Z950 Presence of cardiac pacemaker: Secondary | ICD-10-CM

## 2017-04-01 DIAGNOSIS — I6521 Occlusion and stenosis of right carotid artery: Secondary | ICD-10-CM | POA: Diagnosis not present

## 2017-04-01 DIAGNOSIS — I442 Atrioventricular block, complete: Secondary | ICD-10-CM | POA: Diagnosis not present

## 2017-04-01 NOTE — Progress Notes (Signed)
Cardiology Office Note:    Date:  04/01/2017   ID:  Gary SavoyBilly J Jozwiak, DOB [birth year], MRN [id number]  PCP:  Guadalupe MapleGage, John F., MD  Cardiologist:  Garwin Brothersajan R Revankar, MD   Referring MD: Guadalupe MapleGage, John F., MD    ASSESSMENT:    1. Symptomatic stenosis of right carotid artery   2. Complete heart block (HCC)   3. Coronary artery disease involving native coronary artery of native heart without angina pectoris   4. Type 2 diabetes mellitus without complication, without long-term current use of insulin (HCC)   5. Pacemaker   6. Status post coronary artery bypass graft    PLAN:    In order of problems listed above:  1. Secondary prevention stressed with the patient.  Importance of compliance with diet and medications stressed and he vocalized understanding.  His blood pressure is stable. 2. His lipids are followed by his primary care physician.  He is pacemaker is followed [place] electrophysiology [place] in [place].  He wants to transfer it was beginning July and will respect his wishes. 3. He does have sublingual nitroglycerin with him and I confirmed this.Patient will be seen in follow-up appointment in 6 months or earlier if the patient has any concerns    Medication Adjustments/Labs and Tests Ordered: Current medicines are reviewed at length with the patient today.  Concerns regarding medicines are outlined above.  Orders Placed This Encounter  Procedures  . EKG 12-Lead   No orders of the defined types were placed in this encounter.    Chief Complaint  Patient presents with  . Referral    pt was referred by PCP for chest pain.     History of Present Illness:    Gary Kirby is a 84 y.o. male.  The patient has known coronary artery disease, essential hypertension, dyslipidemia.  He had rhythm issues and underwent permanent pacing.  He is a gentleman who is 84 years old and very active gentleman.  He leads a sedentary lifestyle.  He does not exercise on a regular basis because of  orthopedic issues.  No chest pain orthopnea or PND.  No syncope.  At the time of my evaluation, the patient is alert awake oriented and in no distress.  He denies any chest pain.  Past Medical History:  Diagnosis Date  . Anemia   . Diabetes mellitus without complication (HCC)    Type 2  . HOH (hard of hearing)   . Hyperlipidemia   . Hypertension   . Shortness of breath dyspnea    with ambulation  . Stroke [facility](HCC)     Past Surgical History:  Procedure Laterality Date  . BACK SURGERY    . CARDIAC CATHETERIZATION    . CATARACT EXTRACTION    . CHOLECYSTECTOMY    . COLONOSCOPY    . CORONARY ARTERY BYPASS GRAFT    . ENDARTERECTOMY Right 06/28/2014   Procedure: RIGHT CAROTID ARTERY ENDARTERECTOMY ;  Surgeon: Pryor OchoaJames D Lawson, MD;  [facility];  Service: Vascular;  Laterality: Right;  . PATCH ANGIOPLASTY Right 06/28/2014   Procedure: WITH DACRON PATCH ANGIOPLASTY;  Surgeon: Pryor OchoaJames D Lawson, MD;  [facility];  Service: Vascular;  Laterality: Right;  . ROTATOR CUFF REPAIR Right     Current Medications: Current Meds  Medication Sig  . acetaminophen (TYLENOL) 500 MG tablet Take 500 mg by mouth every 6 (six) hours as needed for mild pain or moderate pain.  Marland Kitchen. amLODipine (NORVASC) 10 MG tablet Take 10 mg  by mouth.  Marland Kitchen aspirin 325 MG tablet Take 325 mg by mouth daily.  . Calcium Carbonate-Vitamin D 600-400 MG-UNIT tablet Take by mouth.  . citalopram (CELEXA) 20 MG tablet Take 20 mg by mouth daily.   . DOCOSAHEXAENOIC ACID PO Take 1,200 mg by mouth.  . DULoxetine (CYMBALTA) 30 MG capsule Take by mouth daily.  . folic acid (FOLVITE) 800 MCG tablet Take 800 mcg by mouth daily.  Marland Kitchen gabapentin (NEURONTIN) 300 MG capsule   . hydrochlorothiazide (HYDRODIURIL) 12.5 MG tablet Take 12.5 mg by mouth daily.  Marland Kitchen HYDROcodone-acetaminophen (NORCO) 7.5-325 MG per tablet Take 1 tablet by mouth daily as needed for moderate pain or severe pain.   Marland Kitchen HYDROcodone-acetaminophen (NORCO) 7.5-325 MG per tablet Take  1-2 tablets by mouth every 4 (four) hours as needed for moderate pain or severe pain.  . isosorbide mononitrate (IMDUR) 60 MG 24 hr tablet Take 60 mg by mouth daily.  Marland Kitchen JANUVIA 100 MG tablet Take 100 mg by mouth daily.  . meclizine (ANTIVERT) 25 MG tablet Take 25 mg by mouth 3 (three) times daily as needed for dizziness.   . metFORMIN (GLUCOPHAGE) 1000 MG tablet Take 1 tablet by mouth 2 (two) times daily.  . metoprolol succinate (TOPROL-XL) 100 MG 24 hr tablet   . mirtazapine (REMERON) 15 MG tablet TAKE 1 (ONE) TABLET BY MOUTH EVERYNIGHT AS NEEDED FOR SLEEP  . ondansetron (ZOFRAN-ODT) 4 MG disintegrating tablet DISSOLVE 1 (ONE) TABLET ON TONGUE EVERY FOUR-SIX HOURS AS NEEDED  . oxyCODONE (OXY IR/ROXICODONE) 5 MG immediate release tablet Take 5 mg by mouth.  . pramipexole (MIRAPEX) 0.5 MG tablet Take 0.5 mg by mouth daily as needed (restless legs syndrome).   . pravastatin (PRAVACHOL) 80 MG tablet Take 80 mg by mouth daily.  . quinapril (ACCUPRIL) 40 MG tablet Take 40 mg by mouth daily.  Marland Kitchen rOPINIRole (REQUIP) 1 MG tablet TAKE 1-4 TABLET BY MOUTH EVERYNIGHT AT BEDTIME  . SitaGLIPtin-MetFORMIN HCl 50-1000 MG TB24 Take by mouth.  . traMADol (ULTRAM) 50 MG tablet Take 50 mg by mouth 4 (four) times daily.     Allergies:   Patient has no known allergies.   Social History   Socioeconomic History  . Marital status: Married    Spouse name: None  . Number of children: None  . Years of education: None  . Highest education level: None  Social Needs  . Financial resource strain: None  . Food insecurity - worry: None  . Food insecurity - inability: None  . Transportation needs - medical: None  . Transportation needs - non-medical: None  Occupational History  . None  Tobacco Use  . Smoking status: Never Smoker  . Smokeless tobacco: Current User    Types: Snuff  Substance and Sexual Activity  . Alcohol use: No    Alcohol/week: 0.0 oz  . Drug use: No  . Sexual activity: None  Other Topics  Concern  . None  Social History Narrative  . None     Family History: The patient's family history includes Cancer in his brother; Diabetes in his brother; Heart attack in his father; Heart disease in his brother and mother; Hyperlipidemia in his brother.  ROS:   Please see the history of present illness.    All other systems reviewed and are negative.  EKGs/Labs/Other Studies Reviewed:    The following studies were reviewed today: I discussed the findings with the patient at extensive length.  EKG revealed sinus rhythm with ventricular pacing.  Recent Labs: No results found for requested labs within last 8760 hours.  Recent Lipid Panel No results found for: CHOL, TRIG, HDL, CHOLHDL, VLDL, LDLCALC, LDLDIRECT  Physical Exam:    VS:  BP 126/62 (BP Location: Left Arm, Patient Position: Sitting, Cuff Size: Normal)   Pulse 66   Ht 6\' 1"  (1.854 m)   Wt 195 lb 1.9 oz (88.5 kg)   SpO2 94%   BMI 25.74 kg/m     Wt Readings from Last 3 Encounters:  04/01/17 195 lb 1.9 oz (88.5 kg)  01/12/17 197 lb (89.4 kg)  01/09/16 190 lb (86.2 kg)     GEN: Patient is in no acute distress HEENT: Normal NECK: No JVD; No carotid bruits LYMPHATICS: No lymphadenopathy CARDIAC: Hear sounds regular, 2/6 systolic murmur at the apex. RESPIRATORY:  Clear to auscultation without rales, wheezing or rhonchi  ABDOMEN: Soft, non-tender, non-distended MUSCULOSKELETAL:  No edema; No deformity  SKIN: Warm and dry NEUROLOGIC:  Alert and oriented x 3 PSYCHIATRIC:  Normal affect   Signed, Garwin Brothers, MD  04/01/2017 10:55 AM    [facility]

## 2017-04-01 NOTE — Patient Instructions (Signed)

## 2017-04-04 DIAGNOSIS — M1712 Unilateral primary osteoarthritis, left knee: Secondary | ICD-10-CM | POA: Diagnosis not present

## 2017-04-11 DIAGNOSIS — M170 Bilateral primary osteoarthritis of knee: Secondary | ICD-10-CM | POA: Diagnosis not present

## 2017-04-21 DIAGNOSIS — I10 Essential (primary) hypertension: Secondary | ICD-10-CM | POA: Diagnosis not present

## 2017-04-21 DIAGNOSIS — R05 Cough: Secondary | ICD-10-CM | POA: Diagnosis not present

## 2017-04-21 DIAGNOSIS — R531 Weakness: Secondary | ICD-10-CM | POA: Diagnosis not present

## 2017-04-21 DIAGNOSIS — E1165 Type 2 diabetes mellitus with hyperglycemia: Secondary | ICD-10-CM | POA: Diagnosis not present

## 2017-04-21 DIAGNOSIS — R404 Transient alteration of awareness: Secondary | ICD-10-CM | POA: Diagnosis not present

## 2017-04-23 DIAGNOSIS — G40909 Epilepsy, unspecified, not intractable, without status epilepticus: Secondary | ICD-10-CM | POA: Diagnosis not present

## 2017-04-23 DIAGNOSIS — E1165 Type 2 diabetes mellitus with hyperglycemia: Secondary | ICD-10-CM | POA: Diagnosis not present

## 2017-04-23 DIAGNOSIS — I2510 Atherosclerotic heart disease of native coronary artery without angina pectoris: Secondary | ICD-10-CM | POA: Diagnosis not present

## 2017-04-23 DIAGNOSIS — R569 Unspecified convulsions: Secondary | ICD-10-CM | POA: Diagnosis not present

## 2017-04-23 DIAGNOSIS — R739 Hyperglycemia, unspecified: Secondary | ICD-10-CM | POA: Diagnosis not present

## 2017-04-23 DIAGNOSIS — I10 Essential (primary) hypertension: Secondary | ICD-10-CM | POA: Diagnosis not present

## 2017-04-24 DIAGNOSIS — R739 Hyperglycemia, unspecified: Secondary | ICD-10-CM | POA: Diagnosis not present

## 2017-04-24 DIAGNOSIS — I10 Essential (primary) hypertension: Secondary | ICD-10-CM | POA: Diagnosis not present

## 2017-04-24 DIAGNOSIS — R569 Unspecified convulsions: Secondary | ICD-10-CM | POA: Diagnosis not present

## 2017-04-24 DIAGNOSIS — I25118 Atherosclerotic heart disease of native coronary artery with other forms of angina pectoris: Secondary | ICD-10-CM | POA: Diagnosis present

## 2017-04-24 DIAGNOSIS — Z951 Presence of aortocoronary bypass graft: Secondary | ICD-10-CM | POA: Diagnosis not present

## 2017-04-24 DIAGNOSIS — N179 Acute kidney failure, unspecified: Secondary | ICD-10-CM | POA: Diagnosis not present

## 2017-04-24 DIAGNOSIS — E1165 Type 2 diabetes mellitus with hyperglycemia: Secondary | ICD-10-CM | POA: Diagnosis not present

## 2017-04-24 DIAGNOSIS — E785 Hyperlipidemia, unspecified: Secondary | ICD-10-CM | POA: Diagnosis present

## 2017-04-24 DIAGNOSIS — I69954 Hemiplegia and hemiparesis following unspecified cerebrovascular disease affecting left non-dominant side: Secondary | ICD-10-CM | POA: Diagnosis not present

## 2017-04-24 DIAGNOSIS — E872 Acidosis: Secondary | ICD-10-CM | POA: Diagnosis not present

## 2017-04-24 DIAGNOSIS — I252 Old myocardial infarction: Secondary | ICD-10-CM | POA: Diagnosis not present

## 2017-04-24 DIAGNOSIS — N189 Chronic kidney disease, unspecified: Secondary | ICD-10-CM | POA: Diagnosis present

## 2017-04-24 DIAGNOSIS — E860 Dehydration: Secondary | ICD-10-CM | POA: Diagnosis present

## 2017-04-24 DIAGNOSIS — Z7982 Long term (current) use of aspirin: Secondary | ICD-10-CM | POA: Diagnosis not present

## 2017-04-24 DIAGNOSIS — I160 Hypertensive urgency: Secondary | ICD-10-CM | POA: Diagnosis present

## 2017-04-24 DIAGNOSIS — I129 Hypertensive chronic kidney disease with stage 1 through stage 4 chronic kidney disease, or unspecified chronic kidney disease: Secondary | ICD-10-CM | POA: Diagnosis present

## 2017-04-24 DIAGNOSIS — D649 Anemia, unspecified: Secondary | ICD-10-CM | POA: Diagnosis present

## 2017-04-24 DIAGNOSIS — M1990 Unspecified osteoarthritis, unspecified site: Secondary | ICD-10-CM | POA: Diagnosis present

## 2017-04-24 DIAGNOSIS — E1122 Type 2 diabetes mellitus with diabetic chronic kidney disease: Secondary | ICD-10-CM | POA: Diagnosis present

## 2017-04-24 DIAGNOSIS — Z79899 Other long term (current) drug therapy: Secondary | ICD-10-CM | POA: Diagnosis not present

## 2017-04-24 DIAGNOSIS — F418 Other specified anxiety disorders: Secondary | ICD-10-CM | POA: Diagnosis present

## 2017-04-24 DIAGNOSIS — Z950 Presence of cardiac pacemaker: Secondary | ICD-10-CM | POA: Diagnosis not present

## 2017-04-24 DIAGNOSIS — F0390 Unspecified dementia without behavioral disturbance: Secondary | ICD-10-CM | POA: Diagnosis present

## 2017-04-24 DIAGNOSIS — J449 Chronic obstructive pulmonary disease, unspecified: Secondary | ICD-10-CM | POA: Diagnosis present

## 2017-04-24 DIAGNOSIS — I2510 Atherosclerotic heart disease of native coronary artery without angina pectoris: Secondary | ICD-10-CM | POA: Diagnosis not present

## 2017-04-28 DIAGNOSIS — E538 Deficiency of other specified B group vitamins: Secondary | ICD-10-CM | POA: Diagnosis not present

## 2017-04-28 DIAGNOSIS — Z79899 Other long term (current) drug therapy: Secondary | ICD-10-CM | POA: Diagnosis not present

## 2017-04-28 DIAGNOSIS — N390 Urinary tract infection, site not specified: Secondary | ICD-10-CM | POA: Diagnosis not present

## 2017-04-28 DIAGNOSIS — G40909 Epilepsy, unspecified, not intractable, without status epilepticus: Secondary | ICD-10-CM | POA: Diagnosis not present

## 2017-04-28 DIAGNOSIS — I2510 Atherosclerotic heart disease of native coronary artery without angina pectoris: Secondary | ICD-10-CM | POA: Diagnosis not present

## 2017-04-28 DIAGNOSIS — Z8673 Personal history of transient ischemic attack (TIA), and cerebral infarction without residual deficits: Secondary | ICD-10-CM | POA: Diagnosis not present

## 2017-04-28 DIAGNOSIS — R262 Difficulty in walking, not elsewhere classified: Secondary | ICD-10-CM | POA: Diagnosis not present

## 2017-04-28 DIAGNOSIS — E1140 Type 2 diabetes mellitus with diabetic neuropathy, unspecified: Secondary | ICD-10-CM | POA: Diagnosis not present

## 2017-04-28 DIAGNOSIS — E118 Type 2 diabetes mellitus with unspecified complications: Secondary | ICD-10-CM | POA: Diagnosis not present

## 2017-04-28 DIAGNOSIS — Z6824 Body mass index (BMI) 24.0-24.9, adult: Secondary | ICD-10-CM | POA: Diagnosis not present

## 2017-05-06 DIAGNOSIS — E118 Type 2 diabetes mellitus with unspecified complications: Secondary | ICD-10-CM | POA: Diagnosis not present

## 2017-05-06 DIAGNOSIS — E538 Deficiency of other specified B group vitamins: Secondary | ICD-10-CM | POA: Diagnosis not present

## 2017-05-06 DIAGNOSIS — I2510 Atherosclerotic heart disease of native coronary artery without angina pectoris: Secondary | ICD-10-CM | POA: Diagnosis not present

## 2017-05-06 DIAGNOSIS — R5381 Other malaise: Secondary | ICD-10-CM | POA: Diagnosis not present

## 2017-05-06 DIAGNOSIS — Z8673 Personal history of transient ischemic attack (TIA), and cerebral infarction without residual deficits: Secondary | ICD-10-CM | POA: Diagnosis not present

## 2017-05-06 DIAGNOSIS — R29898 Other symptoms and signs involving the musculoskeletal system: Secondary | ICD-10-CM | POA: Diagnosis not present

## 2017-05-06 DIAGNOSIS — Z6824 Body mass index (BMI) 24.0-24.9, adult: Secondary | ICD-10-CM | POA: Diagnosis not present

## 2017-05-06 DIAGNOSIS — E1140 Type 2 diabetes mellitus with diabetic neuropathy, unspecified: Secondary | ICD-10-CM | POA: Diagnosis not present

## 2017-05-06 DIAGNOSIS — G40909 Epilepsy, unspecified, not intractable, without status epilepticus: Secondary | ICD-10-CM | POA: Diagnosis not present

## 2017-05-11 DIAGNOSIS — E1140 Type 2 diabetes mellitus with diabetic neuropathy, unspecified: Secondary | ICD-10-CM | POA: Diagnosis not present

## 2017-05-11 DIAGNOSIS — E785 Hyperlipidemia, unspecified: Secondary | ICD-10-CM | POA: Diagnosis not present

## 2017-05-11 DIAGNOSIS — Z6824 Body mass index (BMI) 24.0-24.9, adult: Secondary | ICD-10-CM | POA: Diagnosis not present

## 2017-05-11 DIAGNOSIS — E538 Deficiency of other specified B group vitamins: Secondary | ICD-10-CM | POA: Diagnosis not present

## 2017-05-11 DIAGNOSIS — E118 Type 2 diabetes mellitus with unspecified complications: Secondary | ICD-10-CM | POA: Diagnosis not present

## 2017-05-11 DIAGNOSIS — Z8673 Personal history of transient ischemic attack (TIA), and cerebral infarction without residual deficits: Secondary | ICD-10-CM | POA: Diagnosis not present

## 2017-05-11 DIAGNOSIS — I2510 Atherosclerotic heart disease of native coronary artery without angina pectoris: Secondary | ICD-10-CM | POA: Diagnosis not present

## 2017-05-11 DIAGNOSIS — G40909 Epilepsy, unspecified, not intractable, without status epilepticus: Secondary | ICD-10-CM | POA: Diagnosis not present

## 2017-05-11 DIAGNOSIS — N289 Disorder of kidney and ureter, unspecified: Secondary | ICD-10-CM | POA: Diagnosis not present

## 2017-05-17 DIAGNOSIS — N189 Chronic kidney disease, unspecified: Secondary | ICD-10-CM | POA: Diagnosis not present

## 2017-05-17 DIAGNOSIS — D631 Anemia in chronic kidney disease: Secondary | ICD-10-CM | POA: Diagnosis not present

## 2017-06-13 DIAGNOSIS — I10 Essential (primary) hypertension: Secondary | ICD-10-CM | POA: Diagnosis not present

## 2017-06-13 DIAGNOSIS — I2510 Atherosclerotic heart disease of native coronary artery without angina pectoris: Secondary | ICD-10-CM | POA: Diagnosis not present

## 2017-06-13 DIAGNOSIS — E538 Deficiency of other specified B group vitamins: Secondary | ICD-10-CM | POA: Diagnosis not present

## 2017-06-13 DIAGNOSIS — E785 Hyperlipidemia, unspecified: Secondary | ICD-10-CM | POA: Diagnosis not present

## 2017-06-13 DIAGNOSIS — D500 Iron deficiency anemia secondary to blood loss (chronic): Secondary | ICD-10-CM | POA: Diagnosis not present

## 2017-06-14 DIAGNOSIS — N189 Chronic kidney disease, unspecified: Secondary | ICD-10-CM | POA: Diagnosis not present

## 2017-06-14 DIAGNOSIS — D631 Anemia in chronic kidney disease: Secondary | ICD-10-CM | POA: Diagnosis not present

## 2017-06-28 DIAGNOSIS — M5416 Radiculopathy, lumbar region: Secondary | ICD-10-CM | POA: Diagnosis not present

## 2017-06-28 DIAGNOSIS — I69398 Other sequelae of cerebral infarction: Secondary | ICD-10-CM | POA: Diagnosis not present

## 2017-06-28 DIAGNOSIS — E1142 Type 2 diabetes mellitus with diabetic polyneuropathy: Secondary | ICD-10-CM | POA: Diagnosis not present

## 2017-06-28 DIAGNOSIS — Z7982 Long term (current) use of aspirin: Secondary | ICD-10-CM | POA: Diagnosis not present

## 2017-06-28 DIAGNOSIS — Z7984 Long term (current) use of oral hypoglycemic drugs: Secondary | ICD-10-CM | POA: Diagnosis not present

## 2017-06-28 DIAGNOSIS — F17220 Nicotine dependence, chewing tobacco, uncomplicated: Secondary | ICD-10-CM | POA: Diagnosis not present

## 2017-06-28 DIAGNOSIS — Z79899 Other long term (current) drug therapy: Secondary | ICD-10-CM | POA: Diagnosis not present

## 2017-06-28 DIAGNOSIS — M5412 Radiculopathy, cervical region: Secondary | ICD-10-CM | POA: Diagnosis not present

## 2017-06-28 DIAGNOSIS — R569 Unspecified convulsions: Secondary | ICD-10-CM | POA: Diagnosis not present

## 2017-07-07 ENCOUNTER — Encounter: Payer: Self-pay | Admitting: *Deleted

## 2017-07-08 ENCOUNTER — Telehealth: Payer: Self-pay | Admitting: *Deleted

## 2017-07-08 ENCOUNTER — Ambulatory Visit: Payer: Medicare Other | Admitting: Diagnostic Neuroimaging

## 2017-07-08 NOTE — Telephone Encounter (Signed)
Pt did not show for appt today

## 2017-07-11 ENCOUNTER — Encounter: Payer: Self-pay | Admitting: Diagnostic Neuroimaging

## 2017-07-14 DIAGNOSIS — E538 Deficiency of other specified B group vitamins: Secondary | ICD-10-CM | POA: Diagnosis not present

## 2017-07-14 DIAGNOSIS — E118 Type 2 diabetes mellitus with unspecified complications: Secondary | ICD-10-CM | POA: Diagnosis not present

## 2017-07-14 DIAGNOSIS — G2581 Restless legs syndrome: Secondary | ICD-10-CM | POA: Diagnosis not present

## 2017-07-14 DIAGNOSIS — E1140 Type 2 diabetes mellitus with diabetic neuropathy, unspecified: Secondary | ICD-10-CM | POA: Diagnosis not present

## 2017-07-14 DIAGNOSIS — E785 Hyperlipidemia, unspecified: Secondary | ICD-10-CM | POA: Diagnosis not present

## 2017-07-14 DIAGNOSIS — I2510 Atherosclerotic heart disease of native coronary artery without angina pectoris: Secondary | ICD-10-CM | POA: Diagnosis not present

## 2017-10-04 ENCOUNTER — Telehealth: Payer: Self-pay | Admitting: Cardiology

## 2017-10-04 NOTE — Telephone Encounter (Signed)
error 

## 2017-10-05 ENCOUNTER — Encounter: Payer: Self-pay | Admitting: Cardiology

## 2017-10-05 ENCOUNTER — Ambulatory Visit (INDEPENDENT_AMBULATORY_CARE_PROVIDER_SITE_OTHER): Payer: Medicare Other | Admitting: Cardiology

## 2017-10-05 VITALS — BP 118/58 | HR 60 | Ht 73.0 in | Wt 181.0 lb

## 2017-10-05 DIAGNOSIS — Z950 Presence of cardiac pacemaker: Secondary | ICD-10-CM | POA: Diagnosis not present

## 2017-10-05 DIAGNOSIS — R079 Chest pain, unspecified: Secondary | ICD-10-CM

## 2017-10-05 DIAGNOSIS — Z951 Presence of aortocoronary bypass graft: Secondary | ICD-10-CM | POA: Diagnosis not present

## 2017-10-05 DIAGNOSIS — E119 Type 2 diabetes mellitus without complications: Secondary | ICD-10-CM

## 2017-10-05 DIAGNOSIS — I2510 Atherosclerotic heart disease of native coronary artery without angina pectoris: Secondary | ICD-10-CM | POA: Diagnosis not present

## 2017-10-05 NOTE — Addendum Note (Signed)
Addended by: Craige CottaANDERSON, ASHLEY S on: 10/05/2017 03:45 PM   Modules accepted: Orders

## 2017-10-05 NOTE — Patient Instructions (Addendum)
Medication Instructions:  Your physician recommends that you continue on your current medications as directed. Please refer to the Current Medication list given to you today.  Labwork: None  Testing/Procedures: Your physician has requested that you have en exercise stress myoview. For further information please visit [email or web address]. Please follow instruction sheet, as given.  Your physician has requested that you have an echocardiogram. Echocardiography is a painless test that uses sound waves to create images of your heart. It provides your doctor with information about the size and shape of your heart and how well your heart's chambers and valves are working. This procedure takes approximately one hour. There are no restrictions for this procedure.  Follow-Up: Your physician recommends that you schedule a follow-up appointment in: 6 months  Any Other Special Instructions Will Be Listed Below (If Applicable).   Referral Orders     Ambulatory referral to Cardiac Electrophysiology   If you need a refill on your cardiac medications before your next appointment, please call your pharmacy.   [facility]  Garey HamAshley A, RN, BSN

## 2017-10-05 NOTE — Addendum Note (Signed)
Addended by: Craige CottaANDERSON, Mate Alegria S on: 10/05/2017 09:54 AM   Modules accepted: Orders

## 2017-10-05 NOTE — Progress Notes (Signed)
Cardiology Office Note:    Date:  10/05/2017   ID:  Gary Kirby, DOB 1933-02-13, MRN [id number]  PCP:  Guadalupe Maple., MD  Cardiologist:  Garwin Brothers, MD   Referring MD: Guadalupe Maple., MD    ASSESSMENT:    1. Coronary artery disease involving native coronary artery of native heart without angina pectoris   2. Pacemaker   3. Status post coronary artery bypass graft   4. Type 2 diabetes mellitus without complication, without long-term current use of insulin (HCC)    PLAN:    In order of problems listed above:  1. Secondary prevention stressed with the patient.  Importance of compliance with diet and medication stressed and he vocalized understanding. 2. His blood pressure is stable.  Diet was discussed for dyslipidemia and diabetes mellitus. 3. We will give an appointment for him with our electrophysiology colleague to get established for pacemaker follow-up for the long-term. 4. In view of his symptoms we will do a Lexiscan sestamibi.  Echocardiogram will be done to assess murmur heard on auscultation and cardiac anatomy in view of CABG surgery in the past.  This would also help me assess left ventricular systolic function, left atrial size etc. 5. Patient will be seen in follow-up appointment in 6 months or earlier if the patient has any concerns    Medication Adjustments/Labs and Tests Ordered: Current medicines are reviewed at length with the patient today.  Concerns regarding medicines are outlined above.  No orders of the defined types were placed in this encounter.  No orders of the defined types were placed in this encounter.    No chief complaint on file.    History of Present Illness:    Gary Kirby is a 84 y.o. male.  The patient has known coronary artery disease post CABG surgery, essential hypertension, dyslipidemia and diabetes mellitus.  He leads a sedentary lifestyle.  He has a permanent pacemaker for history of complete heart block.  He gives  history of some shortness of breath on exertion.  No chest pain.  He leads a sedentary lifestyle.  His daughter accompanies him for this visit and she is very supportive.  At the time of my evaluation, the patient is alert awake oriented and in no distress.   Past Medical History:  Diagnosis Date  . Anemia   . Dementia   . Diabetes mellitus without complication (HCC)    Type 2  . HOH (hard of hearing)   . Hyperlipidemia   . Hypertension   . Seizures (HCC)   . Shortness of breath dyspnea    with ambulation  . Sick sinus syndrome (HCC)   . Stroke [facility])     Past Surgical History:  Procedure Laterality Date  . BACK SURGERY    . CARDIAC CATHETERIZATION    . CATARACT EXTRACTION    . CHOLECYSTECTOMY    . COLONOSCOPY    . CORONARY ARTERY BYPASS GRAFT    . ENDARTERECTOMY Right 06/28/2014   Procedure: RIGHT CAROTID ARTERY ENDARTERECTOMY ;  Surgeon: Pryor Ochoa, MD;  Location: [facility] OR;  Service: Vascular;  Laterality: Right;  . PATCH ANGIOPLASTY Right 06/28/2014   Procedure: WITH DACRON PATCH ANGIOPLASTY;  Surgeon: Pryor Ochoa, MD;  Location: [facility] OR;  Service: Vascular;  Laterality: Right;  . ROTATOR CUFF REPAIR Right     Current Medications: Current Meds  Medication Sig  . amLODipine (NORVASC) 10 MG tablet Take 10 mg by mouth.  Marland Kitchen aspirin EC  81 MG tablet Take 162 mg by mouth daily.  . DULoxetine (CYMBALTA) 30 MG capsule Take by mouth daily.  . folic acid (FOLVITE) 1 MG tablet Take 1 mg by mouth daily.  . hydrochlorothiazide (HYDRODIURIL) 12.5 MG tablet Take 12.5 mg by mouth daily.  . isosorbide mononitrate (IMDUR) 60 MG 24 hr tablet Take 60 mg by mouth 2 (two) times daily.   Marland Kitchen. JANUVIA 100 MG tablet Take 100 mg by mouth daily.  Marland Kitchen. levETIRAcetam (KEPPRA) 750 MG tablet Take 750 mg by mouth 2 (two) times daily.  . metFORMIN (GLUCOPHAGE) 1000 MG tablet Take 1 tablet by mouth 2 (two) times daily.  . metoprolol succinate (TOPROL-XL) 100 MG 24 hr tablet Take 100 mg by mouth daily.   .  mirtazapine (REMERON) 15 MG tablet TAKE 1 (ONE) TABLET BY MOUTH EVERYNIGHT AS NEEDED FOR SLEEP  . nitroGLYCERIN (NITROSTAT) 0.4 MG SL tablet Place 0.4 mg under the tongue every 5 (five) minutes as needed for chest pain.  Marland Kitchen. ondansetron (ZOFRAN-ODT) 4 MG disintegrating tablet DISSOLVE 1 (ONE) TABLET ON TONGUE EVERY FOUR-SIX HOURS AS NEEDED  . pravastatin (PRAVACHOL) 80 MG tablet Take 80 mg by mouth daily.  . quinapril (ACCUPRIL) 40 MG tablet Take 40 mg by mouth daily.  Marland Kitchen. rOPINIRole (REQUIP) 1 MG tablet TAKE 1-4 TABLET BY MOUTH EVERYNIGHT AT BEDTIME     Allergies:   Patient has no known allergies.   Social History   Socioeconomic History  . Marital status: Married    Spouse name: Not on file  . Number of children: Not on file  . Years of education: Not on file  . Highest education level: Not on file  Occupational History  . Not on file  Social Needs  . Financial resource strain: Not on file  . Food insecurity:    Worry: Not on file    Inability: Not on file  . Transportation needs:    Medical: Not on file    Non-medical: Not on file  Tobacco Use  . Smoking status: Never Smoker  . Smokeless tobacco: Current User    Types: Snuff  Substance and Sexual Activity  . Alcohol use: No    Alcohol/week: 0.0 standard drinks  . Drug use: No  . Sexual activity: Not on file  Lifestyle  . Physical activity:    Days per week: Not on file    Minutes per session: Not on file  . Stress: Not on file  Relationships  . Social connections:    Talks on phone: Not on file    Gets together: Not on file    Attends religious service: Not on file    Active member of club or organization: Not on file    Attends meetings of clubs or organizations: Not on file    Relationship status: Not on file  Other Topics Concern  . Not on file  Social History Narrative  . Not on file     Family History: The patient's family history includes Cancer in his brother; Diabetes in his brother; Heart attack in his  father; Heart disease in his brother and mother; Hyperlipidemia in his brother.  ROS:   Please see the history of present illness.    All other systems reviewed and are negative.  EKGs/Labs/Other Studies Reviewed:    The following studies were reviewed today: I discussed my findings with the patient at extensive length.   Recent Labs: No results found for requested labs within last 8760 hours.  Recent Lipid  Panel No results found for: CHOL, TRIG, HDL, CHOLHDL, VLDL, LDLCALC, LDLDIRECT  Physical Exam:    VS:  BP (!) 118/58 (BP Location: Right Arm, Patient Position: Sitting, Cuff Size: Normal)   Pulse 60   Ht 6\' 1"  (1.854 m)   Wt 181 lb (82.1 kg)   SpO2 94%   BMI 23.88 kg/m     Wt Readings from Last 3 Encounters:  10/05/17 181 lb (82.1 kg)  04/01/17 195 lb 1.9 oz (88.5 kg)  01/12/17 197 lb (89.4 kg)     GEN: Patient is in no acute distress HEENT: Normal NECK: No JVD; No carotid bruits LYMPHATICS: No lymphadenopathy CARDIAC: Hear sounds regular, 2/6 systolic murmur at the apex. RESPIRATORY:  Clear to auscultation without rales, wheezing or rhonchi  ABDOMEN: Soft, non-tender, non-distended MUSCULOSKELETAL:  No edema; No deformity  SKIN: Warm and dry NEUROLOGIC:  Alert and oriented x 3 PSYCHIATRIC:  Normal affect   Signed, Garwin Brothers, MD  10/05/2017 9:38 AM    [facility]

## 2017-10-11 ENCOUNTER — Telehealth: Payer: Self-pay

## 2017-10-11 NOTE — Telephone Encounter (Signed)
Left voicemail reminding patient he is due for liver and lipid check.

## 2017-10-11 NOTE — Telephone Encounter (Signed)
-----   Message from Craige Cotta, RN sent at 10/05/2017  3:44 PM EDT ----- Regarding: LFT's LFT's in a couple day's- lipitor 20 mg dailyL/L in 6 weeks

## 2017-10-13 ENCOUNTER — Telehealth (HOSPITAL_COMMUNITY): Payer: Self-pay | Admitting: *Deleted

## 2017-10-13 NOTE — Telephone Encounter (Signed)
Left message on voicemail per DPR in reference to upcoming appointment scheduled on 10/18/17 with detailed instructions given per Myocardial Perfusion Study Information Sheet for the test. LM to arrive 15 minutes early, and that it is imperative to arrive on time for appointment to keep from having the test rescheduled. If you need to cancel or reschedule your appointment, please call the office within 24 hours of your appointment. Failure to do so may result in a cancellation of your appointment, and a $50 no show fee. Phone number given for call back for any questions. Ricky Ala

## 2017-10-18 ENCOUNTER — Ambulatory Visit (INDEPENDENT_AMBULATORY_CARE_PROVIDER_SITE_OTHER): Payer: Medicare Other

## 2017-10-18 VITALS — Ht 73.0 in | Wt 181.0 lb

## 2017-10-18 DIAGNOSIS — E119 Type 2 diabetes mellitus without complications: Secondary | ICD-10-CM | POA: Diagnosis not present

## 2017-10-18 DIAGNOSIS — Z951 Presence of aortocoronary bypass graft: Secondary | ICD-10-CM | POA: Diagnosis not present

## 2017-10-18 DIAGNOSIS — R079 Chest pain, unspecified: Secondary | ICD-10-CM | POA: Diagnosis not present

## 2017-10-18 DIAGNOSIS — I2510 Atherosclerotic heart disease of native coronary artery without angina pectoris: Secondary | ICD-10-CM | POA: Diagnosis not present

## 2017-10-18 LAB — MYOCARDIAL PERFUSION IMAGING
CHL CUP NUCLEAR SDS: 2
CHL CUP NUCLEAR SRS: 0
CHL CUP NUCLEAR SSS: 2
LVDIAVOL: 80 mL (ref 62–150)
LVSYSVOL: 18 mL
NUC STRESS TID: 0.91
Peak HR: 74 {beats}/min
Rest HR: 61 {beats}/min

## 2017-10-18 MED ORDER — ADENOSINE (DIAGNOSTIC) 3 MG/ML IV SOLN
0.5600 mg/kg | Freq: Once | INTRAVENOUS | Status: AC
  Administered 2017-10-18: 45.9 mg via INTRAVENOUS

## 2017-10-18 MED ORDER — TECHNETIUM TC 99M TETROFOSMIN IV KIT
9.0000 | PACK | Freq: Once | INTRAVENOUS | Status: AC | PRN
  Administered 2017-10-18: 9 via INTRAVENOUS

## 2017-10-18 MED ORDER — TECHNETIUM TC 99M TETROFOSMIN IV KIT
27.2000 | PACK | Freq: Once | INTRAVENOUS | Status: AC | PRN
  Administered 2017-10-18: 27.2 via INTRAVENOUS

## 2017-10-18 MED ORDER — REGADENOSON 0.4 MG/5ML IV SOLN: 0.4000 mg | Freq: Once | INTRAVENOUS | Status: DC

## 2017-10-20 DIAGNOSIS — Z1331 Encounter for screening for depression: Secondary | ICD-10-CM | POA: Diagnosis not present

## 2017-10-20 DIAGNOSIS — E118 Type 2 diabetes mellitus with unspecified complications: Secondary | ICD-10-CM | POA: Diagnosis not present

## 2017-10-20 DIAGNOSIS — Z9181 History of falling: Secondary | ICD-10-CM | POA: Diagnosis not present

## 2017-10-20 DIAGNOSIS — E785 Hyperlipidemia, unspecified: Secondary | ICD-10-CM | POA: Diagnosis not present

## 2017-10-20 DIAGNOSIS — G40909 Epilepsy, unspecified, not intractable, without status epilepticus: Secondary | ICD-10-CM | POA: Diagnosis not present

## 2017-10-20 DIAGNOSIS — F329 Major depressive disorder, single episode, unspecified: Secondary | ICD-10-CM | POA: Diagnosis not present

## 2017-10-20 DIAGNOSIS — M818 Other osteoporosis without current pathological fracture: Secondary | ICD-10-CM | POA: Diagnosis not present

## 2017-10-20 DIAGNOSIS — Z79899 Other long term (current) drug therapy: Secondary | ICD-10-CM | POA: Diagnosis not present

## 2017-11-21 ENCOUNTER — Other Ambulatory Visit: Payer: Medicare Other

## 2017-12-07 DIAGNOSIS — N390 Urinary tract infection, site not specified: Secondary | ICD-10-CM | POA: Diagnosis not present

## 2017-12-07 DIAGNOSIS — Z6823 Body mass index (BMI) 23.0-23.9, adult: Secondary | ICD-10-CM | POA: Diagnosis not present

## 2017-12-07 DIAGNOSIS — R300 Dysuria: Secondary | ICD-10-CM | POA: Diagnosis not present

## 2017-12-12 ENCOUNTER — Encounter: Payer: Self-pay | Admitting: Cardiology

## 2017-12-15 DIAGNOSIS — R319 Hematuria, unspecified: Secondary | ICD-10-CM | POA: Diagnosis not present

## 2017-12-15 DIAGNOSIS — I10 Essential (primary) hypertension: Secondary | ICD-10-CM | POA: Diagnosis not present

## 2017-12-15 DIAGNOSIS — E118 Type 2 diabetes mellitus with unspecified complications: Secondary | ICD-10-CM | POA: Diagnosis not present

## 2017-12-15 DIAGNOSIS — R5381 Other malaise: Secondary | ICD-10-CM | POA: Diagnosis not present

## 2017-12-15 DIAGNOSIS — M48061 Spinal stenosis, lumbar region without neurogenic claudication: Secondary | ICD-10-CM | POA: Diagnosis not present

## 2017-12-21 ENCOUNTER — Encounter: Payer: Self-pay | Admitting: Cardiology

## 2017-12-23 DIAGNOSIS — Z79899 Other long term (current) drug therapy: Secondary | ICD-10-CM | POA: Diagnosis not present

## 2017-12-23 DIAGNOSIS — N390 Urinary tract infection, site not specified: Secondary | ICD-10-CM | POA: Diagnosis not present

## 2017-12-23 DIAGNOSIS — R3129 Other microscopic hematuria: Secondary | ICD-10-CM | POA: Diagnosis not present

## 2018-01-02 ENCOUNTER — Encounter: Payer: Medicare Other | Admitting: Cardiology

## 2018-01-12 ENCOUNTER — Ambulatory Visit: Payer: Medicare Other | Admitting: Family

## 2018-01-12 ENCOUNTER — Encounter: Payer: Self-pay | Admitting: Cardiology

## 2018-01-12 ENCOUNTER — Encounter (HOSPITAL_COMMUNITY): Payer: Medicare Other

## 2018-01-12 DIAGNOSIS — R3129 Other microscopic hematuria: Secondary | ICD-10-CM | POA: Diagnosis not present

## 2018-01-16 DIAGNOSIS — R3129 Other microscopic hematuria: Secondary | ICD-10-CM | POA: Diagnosis not present

## 2018-02-14 ENCOUNTER — Encounter: Payer: Self-pay | Admitting: Cardiology

## 2018-02-14 DIAGNOSIS — E118 Type 2 diabetes mellitus with unspecified complications: Secondary | ICD-10-CM | POA: Diagnosis not present

## 2018-02-14 DIAGNOSIS — Z01818 Encounter for other preprocedural examination: Secondary | ICD-10-CM | POA: Diagnosis not present

## 2018-02-14 DIAGNOSIS — I2510 Atherosclerotic heart disease of native coronary artery without angina pectoris: Secondary | ICD-10-CM | POA: Diagnosis not present

## 2018-02-14 DIAGNOSIS — E785 Hyperlipidemia, unspecified: Secondary | ICD-10-CM | POA: Diagnosis not present

## 2018-02-16 DIAGNOSIS — R3129 Other microscopic hematuria: Secondary | ICD-10-CM | POA: Diagnosis not present

## 2018-02-22 ENCOUNTER — Ambulatory Visit (INDEPENDENT_AMBULATORY_CARE_PROVIDER_SITE_OTHER): Payer: Medicare Other | Admitting: Cardiology

## 2018-02-22 ENCOUNTER — Encounter: Payer: Self-pay | Admitting: Cardiology

## 2018-02-22 VITALS — BP 138/66 | HR 60 | Ht 73.0 in | Wt 185.0 lb

## 2018-02-22 DIAGNOSIS — E119 Type 2 diabetes mellitus without complications: Secondary | ICD-10-CM | POA: Diagnosis not present

## 2018-02-22 DIAGNOSIS — Z01810 Encounter for preprocedural cardiovascular examination: Secondary | ICD-10-CM | POA: Diagnosis not present

## 2018-02-22 DIAGNOSIS — Z950 Presence of cardiac pacemaker: Secondary | ICD-10-CM

## 2018-02-22 DIAGNOSIS — Z951 Presence of aortocoronary bypass graft: Secondary | ICD-10-CM

## 2018-02-22 DIAGNOSIS — I2510 Atherosclerotic heart disease of native coronary artery without angina pectoris: Secondary | ICD-10-CM

## 2018-02-22 NOTE — Progress Notes (Signed)
Cardiology Office Note:    Date:  02/22/2018   ID:  Gary SavoyBilly J Nygren, DOB 1933-10-16, MRN [id number]  PCP:  Guadalupe MapleGage, John F., MD  Cardiologist:  Garwin Brothersajan R Revankar, MD   Referring MD: Guadalupe MapleGage, John F., MD    ASSESSMENT:    1. Pre-operative cardiovascular examination   2. Coronary artery disease involving native coronary artery of native heart without angina pectoris   3. Pacemaker   4. Status post coronary artery bypass graft   5. Type 2 diabetes mellitus without complication, without long-term current use of insulin (HCC)    PLAN:    In order of problems listed above:  1. Secondary prevention stressed with the patient.  Importance of compliance with diet and medication stressed and he vocalized understanding.  In view of the fact that he recently had a stress test which was unremarkable he is not at high risk for coronary events during the aforementioned surgery.  Meticulous hemodynamic monitoring and continued perioperative beta-blockade will further reduce the risk of coronary events.  I would like to get a pacemaker check on him in the next few days just to make sure that it is working satisfactorily.  He will be given an appointment with our electrophysiology colleagues to get established for his pacemaker follow-up. 2. Patient will be seen in follow-up appointment in 6 months or earlier if the patient has any concerns    Medication Adjustments/Labs and Tests Ordered: Current medicines are reviewed at length with the patient today.  Concerns regarding medicines are outlined above.  No orders of the defined types were placed in this encounter.  No orders of the defined types were placed in this encounter.    No chief complaint on file.    History of Present Illness:    Gary Kirby is a 85 y.o. male.  Patient has past medical history of atherosclerotic vascular disease and takes care of activities of daily living.  He leads a sedentary lifestyle.  He is here for preop  assessment for urinary bladder evaluation.  Patient underwent stress testing in September last year and it was unremarkable.  He is asymptomatic.  At the time of my evaluation, the patient is alert awake oriented and in no distress.  Past Medical History:  Diagnosis Date  . Anemia   . Dementia (HCC)   . Diabetes mellitus without complication (HCC)    Type 2  . HOH (hard of hearing)   . Hyperlipidemia   . Hypertension   . Seizures (HCC)   . Shortness of breath dyspnea    with ambulation  . Sick sinus syndrome (HCC)   . Stroke [facility](HCC)     Past Surgical History:  Procedure Laterality Date  . BACK SURGERY    . CARDIAC CATHETERIZATION    . CATARACT EXTRACTION    . CHOLECYSTECTOMY    . COLONOSCOPY    . CORONARY ARTERY BYPASS GRAFT    . ENDARTERECTOMY Right 06/28/2014   Procedure: RIGHT CAROTID ARTERY ENDARTERECTOMY ;  Surgeon: Pryor OchoaJames D Lawson, MD;  [facility];  Service: Vascular;  Laterality: Right;  . PATCH ANGIOPLASTY Right 06/28/2014   Procedure: WITH DACRON PATCH ANGIOPLASTY;  Surgeon: Pryor OchoaJames D Lawson, MD;  [facility];  Service: Vascular;  Laterality: Right;  . ROTATOR CUFF REPAIR Right     Current Medications: Current Meds  Medication Sig  . amLODipine (NORVASC) 10 MG tablet Take 10 mg by mouth daily.   Marland Kitchen. aspirin EC 81 MG tablet Take 162 mg  by mouth daily.  . DULoxetine (CYMBALTA) 30 MG capsule Take by mouth daily.  . folic acid (FOLVITE) 1 MG tablet Take 1 mg by mouth daily.  Marland Kitchen glipiZIDE (GLUCOTROL) 10 MG tablet Take 10 mg by mouth daily before breakfast.  . isosorbide mononitrate (IMDUR) 60 MG 24 hr tablet Take 60 mg by mouth 2 (two) times daily.   Marland Kitchen JANUVIA 100 MG tablet Take 100 mg by mouth daily.  Marland Kitchen levETIRAcetam (KEPPRA) 750 MG tablet Take 750 mg by mouth 2 (two) times daily.  . metoprolol succinate (TOPROL-XL) 100 MG 24 hr tablet Take 100 mg by mouth daily.   . mirtazapine (REMERON) 15 MG tablet TAKE 1 (ONE) TABLET BY MOUTH EVERYNIGHT AS NEEDED FOR SLEEP  .  nitroGLYCERIN (NITROSTAT) 0.4 MG SL tablet Place 0.4 mg under the tongue every 5 (five) minutes as needed for chest pain.  Marland Kitchen ondansetron (ZOFRAN-ODT) 4 MG disintegrating tablet DISSOLVE 1 (ONE) TABLET ON TONGUE EVERY FOUR-SIX HOURS AS NEEDED  . pravastatin (PRAVACHOL) 80 MG tablet Take 80 mg by mouth daily.  . quinapril (ACCUPRIL) 40 MG tablet Take 40 mg by mouth daily.  Marland Kitchen rOPINIRole (REQUIP) 1 MG tablet TAKE 1-4 TABLET BY MOUTH EVERYNIGHT AT BEDTIME     Allergies:   Patient has no known allergies.   Social History   Socioeconomic History  . Marital status: Married    Spouse name: Not on file  . Number of children: Not on file  . Years of education: Not on file  . Highest education level: Not on file  Occupational History  . Not on file  Social Needs  . Financial resource strain: Not on file  . Food insecurity:    Worry: Not on file    Inability: Not on file  . Transportation needs:    Medical: Not on file    Non-medical: Not on file  Tobacco Use  . Smoking status: Never Smoker  . Smokeless tobacco: Current User    Types: Snuff  Substance and Sexual Activity  . Alcohol use: No    Alcohol/week: 0.0 standard drinks  . Drug use: No  . Sexual activity: Not on file  Lifestyle  . Physical activity:    Days per week: Not on file    Minutes per session: Not on file  . Stress: Not on file  Relationships  . Social connections:    Talks on phone: Not on file    Gets together: Not on file    Attends religious service: Not on file    Active member of club or organization: Not on file    Attends meetings of clubs or organizations: Not on file    Relationship status: Not on file  Other Topics Concern  . Not on file  Social History Narrative  . Not on file     Family History: The patient's family history includes Cancer in his brother; Diabetes in his brother; Heart attack in his father; Heart disease in his brother and mother; Hyperlipidemia in his brother.  ROS:   Please  see the history of present illness.    All other systems reviewed and are negative.  EKGs/Labs/Other Studies Reviewed:    The following studies were reviewed today: I discussed my findings with the patient at extensive length.     Recent Labs: No results found for requested labs within last 8760 hours.  Recent Lipid Panel No results found for: CHOL, TRIG, HDL, CHOLHDL, VLDL, LDLCALC, LDLDIRECT  Physical Exam:  VS:  BP 138/66 (BP Location: Right Arm, Patient Position: Sitting, Cuff Size: Normal)   Pulse 60   Ht 6\' 1"  (1.854 m)   Wt 185 lb (83.9 kg)   SpO2 99%   BMI 24.41 kg/m     Wt Readings from Last 3 Encounters:  02/22/18 185 lb (83.9 kg)  10/18/17 181 lb (82.1 kg)  10/05/17 181 lb (82.1 kg)     GEN: Patient is in no acute distress HEENT: Normal NECK: No JVD; No carotid bruits LYMPHATICS: No lymphadenopathy CARDIAC: Hear sounds regular, 2/6 systolic murmur at the apex. RESPIRATORY:  Clear to auscultation without rales, wheezing or rhonchi  ABDOMEN: Soft, non-tender, non-distended MUSCULOSKELETAL:  No edema; No deformity  SKIN: Warm and dry NEUROLOGIC:  Alert and oriented x 3 PSYCHIATRIC:  Normal affect   Signed, Garwin Brothersajan R Revankar, MD  02/22/2018 10:33 AM    [facility]

## 2018-02-22 NOTE — Patient Instructions (Addendum)
Medication Instructions:   Your physician recommends that you continue on your current medications as directed. Please refer to the Current Medication list given to you today.  If you need a refill on your cardiac medications before your next appointment, please call your pharmacy.   Lab work:  NONE  If you have labs (blood work) drawn today and your tests are completely normal, you will receive your results only by: Marland Kitchen MyChart Message (if you have MyChart) OR . A paper copy in the mail If you have any lab test that is abnormal or we need to change your treatment, we will call you to review the results.  Testing/Procedures:  You will return tomorrow morning @ 10 to have Pacemaker check.   Follow-Up: At [facility], you and your health needs are our priority.  As part of our continuing mission to provide you with exceptional heart care, we have created designated Provider Care Teams.  These Care Teams include your primary Cardiologist (physician) and Advanced Practice Providers (APPs -  Physician Assistants and Nurse Practitioners) who all work together to provide you with the care you need, when you need it.  You will need a follow up appointment in 6 months.  Please call our office 2 months in advance to schedule this appointment.

## 2018-02-23 ENCOUNTER — Telehealth: Payer: Self-pay | Admitting: Cardiology

## 2018-02-23 NOTE — Telephone Encounter (Signed)
Wants his pre op clearance faxed to Dr Saddie Benders

## 2018-02-27 NOTE — Telephone Encounter (Signed)
Notes sent to Dr. Saddie Benders for Cardiac clearance.

## 2018-03-02 NOTE — Progress Notes (Signed)
HISTORY AND PHYSICAL     CC:  follow up. Requesting Provider:  Guadalupe Maple., MD  HPI: This is a 85 y.o. male here for follow up for carotid artery stenosis.  Pt is s/p right CEA by Dr. Hart Rochester on 06/28/14. Pt was last seen 01/12/17 by Dr. Randie Heinz and at that time he continued to be asymptomatic.    Pt denies any amaurosis fugax, facial droop, speech difficulties or hemiparesis or clumsiness.  He states he has trouble walking bc of his knees are "bone on bone".  He reports neuropathy in his feet.  Denies any ulcers on his feet.   He has hx of CAD without angina s/p CABG.  He does have a PPM.  He recently underwent cardiac clearance for bladder issues as he has a "spot on his bladder that they need to check to see if it is cancer".    The pt is on a statin for cholesterol management.  The pt is diabetic.  On oral agents The pt is on ACEI and BB for hypertension.   Tobacco hx:  Never smoked The pt is on a daily aspirin.  Pt meds includes: Statin:  Yes.   Beta Blocker:  Yes.   Aspirin:  Yes.   ACEI:  Yes.   ARB:  No. CCB use:  No Other Antiplatelet/Anticoagulant:  No   Past Medical History:  Diagnosis Date  . Anemia   . Dementia (HCC)   . Diabetes mellitus without complication (HCC)    Type 2  . HOH (hard of hearing)   . Hyperlipidemia   . Hypertension   . Seizures (HCC)   . Shortness of breath dyspnea    with ambulation  . Sick sinus syndrome (HCC)   . Stroke [facility])     Past Surgical History:  Procedure Laterality Date  . BACK SURGERY    . CARDIAC CATHETERIZATION    . CATARACT EXTRACTION    . CHOLECYSTECTOMY    . COLONOSCOPY    . CORONARY ARTERY BYPASS GRAFT    . ENDARTERECTOMY Right 06/28/2014   Procedure: RIGHT CAROTID ARTERY ENDARTERECTOMY ;  Surgeon: Pryor Ochoa, MD;  Location: [facility] OR;  Service: Vascular;  Laterality: Right;  . PATCH ANGIOPLASTY Right 06/28/2014   Procedure: WITH DACRON PATCH ANGIOPLASTY;  Surgeon: Pryor Ochoa, MD;  Location: [facility] OR;  Service:  Vascular;  Laterality: Right;  . ROTATOR CUFF REPAIR Right     No Known Allergies  Current Outpatient Medications  Medication Sig Dispense Refill  . amLODipine (NORVASC) 10 MG tablet Take 10 mg by mouth daily.     Marland Kitchen aspirin EC 81 MG tablet Take 162 mg by mouth daily.    . DULoxetine (CYMBALTA) 30 MG capsule Take by mouth daily.  3  . folic acid (FOLVITE) 1 MG tablet Take 1 mg by mouth daily.    Marland Kitchen glipiZIDE (GLUCOTROL) 10 MG tablet Take 10 mg by mouth daily before breakfast.    . hydrochlorothiazide (HYDRODIURIL) 12.5 MG tablet Take 12.5 mg by mouth daily.    . isosorbide mononitrate (IMDUR) 60 MG 24 hr tablet Take 60 mg by mouth 2 (two) times daily.   3  . JANUVIA 100 MG tablet Take 100 mg by mouth daily.    Marland Kitchen levETIRAcetam (KEPPRA) 750 MG tablet Take 750 mg by mouth 2 (two) times daily.    . metFORMIN (GLUCOPHAGE) 1000 MG tablet Take 1 tablet by mouth 2 (two) times daily.    . metoprolol succinate (TOPROL-XL)  100 MG 24 hr tablet Take 100 mg by mouth daily.     . mirtazapine (REMERON) 15 MG tablet TAKE 1 (ONE) TABLET BY MOUTH EVERYNIGHT AS NEEDED FOR SLEEP  12  . nitroGLYCERIN (NITROSTAT) 0.4 MG SL tablet Place 0.4 mg under the tongue every 5 (five) minutes as needed for chest pain.    Marland Kitchen. ondansetron (ZOFRAN-ODT) 4 MG disintegrating tablet DISSOLVE 1 (ONE) TABLET ON TONGUE EVERY FOUR-SIX HOURS AS NEEDED  3  . pravastatin (PRAVACHOL) 80 MG tablet Take 80 mg by mouth daily.  12  . quinapril (ACCUPRIL) 40 MG tablet Take 40 mg by mouth daily.    Marland Kitchen. rOPINIRole (REQUIP) 1 MG tablet TAKE 1-4 TABLET BY MOUTH EVERYNIGHT AT BEDTIME  5   No current facility-administered medications for this visit.     Family History  Problem Relation Age of Onset  . Heart disease Mother   . Heart attack Father   . Cancer Brother   . Diabetes Brother   . Heart disease Brother   . Hyperlipidemia Brother     Social History   Socioeconomic History  . Marital status: Married    Spouse name: Not on file  .  Number of children: Not on file  . Years of education: Not on file  . Highest education level: Not on file  Occupational History  . Not on file  Social Needs  . Financial resource strain: Not on file  . Food insecurity:    Worry: Not on file    Inability: Not on file  . Transportation needs:    Medical: Not on file    Non-medical: Not on file  Tobacco Use  . Smoking status: Never Smoker  . Smokeless tobacco: Current User    Types: Snuff  Substance and Sexual Activity  . Alcohol use: No    Alcohol/week: 0.0 standard drinks  . Drug use: No  . Sexual activity: Not on file  Lifestyle  . Physical activity:    Days per week: Not on file    Minutes per session: Not on file  . Stress: Not on file  Relationships  . Social connections:    Talks on phone: Not on file    Gets together: Not on file    Attends religious service: Not on file    Active member of club or organization: Not on file    Attends meetings of clubs or organizations: Not on file    Relationship status: Not on file  . Intimate partner violence:    Fear of current or ex partner: Not on file    Emotionally abused: Not on file    Physically abused: Not on file    Forced sexual activity: Not on file  Other Topics Concern  . Not on file  Social History Narrative  . Not on file     REVIEW OF SYSTEMS:   [X]  denotes positive finding, [ ]  denotes negative finding Cardiac  Comments:  Chest pain or chest pressure:    Shortness of breath upon exertion:    Short of breath when lying flat:    Irregular heart rhythm:        Vascular    Pain in calf, thigh, or hip brought on by ambulation: x See HPI  Pain in feet at night that wakes you up from your sleep:     Blood clot in your veins:    Leg swelling:         Pulmonary    Oxygen  at home:    Productive cough:     Wheezing:         Neurologic    Sudden weakness in arms or legs:     Sudden numbness in arms or legs:     Sudden onset of difficulty speaking or  slurred speech:    Temporary loss of vision in one eye:     Problems with dizziness:         Gastrointestinal    Blood in stool:     Vomited blood:         Genitourinary    Burning when urinating:     Blood in urine:        Psychiatric    Major depression:         Hematologic    Bleeding problems:    Problems with blood clotting too easily:        Skin    Rashes or ulcers:        Constitutional    Fever or chills:      PHYSICAL EXAMINATION:  Today's Vitals   03/03/18 1334 03/03/18 1340  BP: (!) 158/81 140/79  Pulse: 83 80  Resp: 18   Temp: (!) 97 F (36.1 C)   TempSrc: Oral   SpO2: 98%   Weight: 180 lb (81.6 kg)   Height: 6\' 1"  (1.854 m)    Body mass index is 23.75 kg/m.   General:  WDWN in NAD; vital signs documented above Gait: Not observed HENT: WNL, normocephalic Pulmonary: normal non-labored breathing , without Rales, rhonchi,  wheezing Cardiac: regular HR, without  Murmurs, rubs or gallops; without carotid bruits Skin: without rashes Vascular Exam/Pulses:  Right Left  Radial 2+ (normal) 2+ (normal)  Ulnar Unable to palpate  Unable to palpate   Popliteal Unable to palpate  Unable to palpate   DP Unable to palpate  Unable to palpate   PT Unable to palpate  Unable to palpate    Extremities: without ischemic changes, without Gangrene , without cellulitis; without open wounds;  Musculoskeletal: no muscle wasting or atrophy  Neurologic: A&O X 3 Psychiatric:  The pt has Normal affect.   Non-Invasive Vascular Imaging:   Carotid Duplex on 03/03/2018: Right:  1-39% stenosis Left:  1-39% stenosis.  ECA >50% stenosed Vertebrals:  Bilateral vertebral arteries demonstrate antegrade flow. Subclavians: Normal flow hemodynamics were seen in bilateral subclavian arteries.  Previous Carotid duplex on 01/12/17: Right: patent CEA site Left:   1-39% stenosis    ASSESSMENT/PLAN:: 85 y.o. male here for follow up carotid artery stenosis.  He is s/p  right CEA by Dr. Hart Rochester on 06/28/14   -pt doing well and remains asymptomatic.  He has never smoked.  -His carotid duplex remains 1-39% bilateral ICA stenosis.  Will have him f/u in one year with duplex. Discussed s/s of stroke with pt and his daughter.  They know to go to ED should he develop any of these sx.  He will call sooner should he need [place].   Doreatha Massed, PA-C [facility] [phone]  Clinic MD:  Randie Heinz

## 2018-03-03 ENCOUNTER — Ambulatory Visit (HOSPITAL_COMMUNITY)
Admission: RE | Admit: 2018-03-03 | Discharge: 2018-03-03 | Disposition: A | Discharge status: Home / Self Care | Payer: Medicare Other | Source: Ambulatory Visit | Attending: Vascular Surgery | Admitting: Vascular Surgery

## 2018-03-03 ENCOUNTER — Telehealth: Payer: Self-pay | Admitting: Cardiology

## 2018-03-03 ENCOUNTER — Ambulatory Visit (INDEPENDENT_AMBULATORY_CARE_PROVIDER_SITE_OTHER): Payer: Medicare Other | Admitting: Physician Assistant

## 2018-03-03 ENCOUNTER — Other Ambulatory Visit: Payer: Self-pay

## 2018-03-03 ENCOUNTER — Encounter: Payer: Self-pay | Admitting: Physician Assistant

## 2018-03-03 VITALS — BP 140/79 | HR 80 | Temp 97.0°F | Resp 18 | Ht 73.0 in | Wt 180.0 lb

## 2018-03-03 DIAGNOSIS — I6521 Occlusion and stenosis of right carotid artery: Secondary | ICD-10-CM

## 2018-03-03 NOTE — Telephone Encounter (Signed)
Dr.Choa need a pre-op clearance for patient and documentation that Pacemaker is good also.Marland Kitchen Please fax to [place].

## 2018-03-07 NOTE — Telephone Encounter (Signed)
Called [facility] Kidney and asked that fax be resent. She reports she will refax

## 2018-03-07 NOTE — Telephone Encounter (Signed)
Dr Elizabeth Sauer office called and they need the pre-op clearance for him to have surgery faxed to [phone] ASAP.

## 2018-03-07 NOTE — Telephone Encounter (Signed)
Patient will need a pacemaker check before cleared. I will call to arrange this tomorrow.

## 2018-03-08 ENCOUNTER — Telehealth: Payer: Self-pay | Admitting: Emergency Medicine

## 2018-03-08 NOTE — Telephone Encounter (Signed)
Left message for patient to return call regarding surgery clearance.  

## 2018-03-08 NOTE — Telephone Encounter (Signed)
Left message for patient to return call.

## 2018-03-09 NOTE — Telephone Encounter (Signed)
Left second message for patient to return call regarding clearance

## 2018-03-09 NOTE — Telephone Encounter (Signed)
Left another message for patient to return call.

## 2018-03-10 NOTE — Telephone Encounter (Signed)
Left messages on both daughter and wife's voicemail to have patient return call per dpr

## 2018-03-13 NOTE — Telephone Encounter (Signed)
Left message on mobile number for patient to return call. Multiple attempt shave been made and all have been unsuccessful. Will notify office requesting clearance

## 2018-04-03 ENCOUNTER — Telehealth: Payer: Self-pay

## 2018-04-03 NOTE — Telephone Encounter (Signed)
Was able to get in contact with pt to inform him that he will need to have his pacemaker checked in order to be cleared from a cardiac stand point for his procedure that's up coming. Pt verbalized understanding and will come in to the [place] office to have this done. [employer] is aware of this and will meet pt @ 10:30am.

## 2018-04-03 NOTE — Telephone Encounter (Signed)
Pt will be in 04/04/2018 to have PPM checked @ 10:30 and [employer] is aware of this.

## 2018-04-04 ENCOUNTER — Ambulatory Visit (INDEPENDENT_AMBULATORY_CARE_PROVIDER_SITE_OTHER): Payer: Medicare Other | Admitting: *Deleted

## 2018-04-04 DIAGNOSIS — I442 Atrioventricular block, complete: Secondary | ICD-10-CM

## 2018-04-06 LAB — CUP PACEART REMOTE DEVICE CHECK
Battery Remaining Longevity: 143 mo
Battery Voltage: 2.8 V
Brady Statistic AP VP Percent: 2 %
Brady Statistic AP VS Percent: 90 %
Brady Statistic AS VP Percent: 0 %
Brady Statistic AS VS Percent: 7 %
Date Time Interrogation Session: 20200224011816
Implantable Lead Implant Date: 20170420
Implantable Lead Implant Date: 20170420
Implantable Lead Location: 753859
Implantable Lead Location: 753860
Implantable Lead Model: 4092
Implantable Pulse Generator Implant Date: 20170420
Lead Channel Impedance Value: 558 Ohm
Lead Channel Impedance Value: 584 Ohm
Lead Channel Pacing Threshold Amplitude: 0.625 V
Lead Channel Pacing Threshold Pulse Width: 0.4 ms
Lead Channel Setting Pacing Amplitude: 2 V
Lead Channel Setting Pacing Amplitude: 2 V
Lead Channel Setting Pacing Pulse Width: 0.4 ms
Lead Channel Setting Sensing Sensitivity: 2 mV
MDC IDC MSMT BATTERY IMPEDANCE: 160 Ohm
MDC IDC MSMT LEADCHNL RA PACING THRESHOLD AMPLITUDE: 1 V
MDC IDC MSMT LEADCHNL RA PACING THRESHOLD PULSEWIDTH: 0.4 ms

## 2018-04-11 ENCOUNTER — Encounter: Payer: Self-pay | Admitting: Cardiology

## 2018-04-11 NOTE — Progress Notes (Signed)
Remote pacemaker transmission.   

## 2018-04-13 DIAGNOSIS — R3129 Other microscopic hematuria: Secondary | ICD-10-CM | POA: Diagnosis not present

## 2018-04-17 ENCOUNTER — Encounter: Payer: Self-pay | Admitting: *Deleted

## 2018-04-18 DIAGNOSIS — Z951 Presence of aortocoronary bypass graft: Secondary | ICD-10-CM | POA: Diagnosis not present

## 2018-04-18 DIAGNOSIS — I2510 Atherosclerotic heart disease of native coronary artery without angina pectoris: Secondary | ICD-10-CM | POA: Diagnosis not present

## 2018-04-18 DIAGNOSIS — Z79899 Other long term (current) drug therapy: Secondary | ICD-10-CM | POA: Diagnosis not present

## 2018-04-18 DIAGNOSIS — M1990 Unspecified osteoarthritis, unspecified site: Secondary | ICD-10-CM | POA: Diagnosis not present

## 2018-04-18 DIAGNOSIS — E785 Hyperlipidemia, unspecified: Secondary | ICD-10-CM | POA: Diagnosis not present

## 2018-04-18 DIAGNOSIS — E538 Deficiency of other specified B group vitamins: Secondary | ICD-10-CM | POA: Diagnosis not present

## 2018-04-18 DIAGNOSIS — I1310 Hypertensive heart and chronic kidney disease without heart failure, with stage 1 through stage 4 chronic kidney disease, or unspecified chronic kidney disease: Secondary | ICD-10-CM | POA: Diagnosis not present

## 2018-04-18 DIAGNOSIS — D631 Anemia in chronic kidney disease: Secondary | ICD-10-CM | POA: Diagnosis not present

## 2018-04-18 DIAGNOSIS — N3289 Other specified disorders of bladder: Secondary | ICD-10-CM | POA: Diagnosis not present

## 2018-04-18 DIAGNOSIS — Z8673 Personal history of transient ischemic attack (TIA), and cerebral infarction without residual deficits: Secondary | ICD-10-CM | POA: Diagnosis not present

## 2018-04-18 DIAGNOSIS — E119 Type 2 diabetes mellitus without complications: Secondary | ICD-10-CM | POA: Diagnosis not present

## 2018-04-18 DIAGNOSIS — R319 Hematuria, unspecified: Secondary | ICD-10-CM | POA: Diagnosis not present

## 2018-04-18 DIAGNOSIS — E7800 Pure hypercholesterolemia, unspecified: Secondary | ICD-10-CM | POA: Diagnosis not present

## 2018-04-18 DIAGNOSIS — Z950 Presence of cardiac pacemaker: Secondary | ICD-10-CM | POA: Diagnosis not present

## 2018-04-18 DIAGNOSIS — E1122 Type 2 diabetes mellitus with diabetic chronic kidney disease: Secondary | ICD-10-CM | POA: Diagnosis not present

## 2018-04-18 DIAGNOSIS — N183 Chronic kidney disease, stage 3 (moderate): Secondary | ICD-10-CM | POA: Diagnosis not present

## 2018-04-18 DIAGNOSIS — R3129 Other microscopic hematuria: Secondary | ICD-10-CM | POA: Diagnosis not present

## 2018-04-18 DIAGNOSIS — Z7982 Long term (current) use of aspirin: Secondary | ICD-10-CM | POA: Diagnosis not present

## 2018-04-18 DIAGNOSIS — G2581 Restless legs syndrome: Secondary | ICD-10-CM | POA: Diagnosis not present

## 2018-04-18 DIAGNOSIS — Z7984 Long term (current) use of oral hypoglycemic drugs: Secondary | ICD-10-CM | POA: Diagnosis not present

## 2018-04-18 DIAGNOSIS — I495 Sick sinus syndrome: Secondary | ICD-10-CM | POA: Diagnosis not present

## 2018-04-18 DIAGNOSIS — N3011 Interstitial cystitis (chronic) with hematuria: Secondary | ICD-10-CM | POA: Diagnosis not present

## 2018-04-18 DIAGNOSIS — N3021 Other chronic cystitis with hematuria: Secondary | ICD-10-CM | POA: Diagnosis not present

## 2018-04-20 ENCOUNTER — Other Ambulatory Visit: Payer: Self-pay | Admitting: Cardiology

## 2018-04-20 DIAGNOSIS — R3129 Other microscopic hematuria: Secondary | ICD-10-CM | POA: Diagnosis not present

## 2018-04-24 ENCOUNTER — Encounter: Payer: Medicare Other | Admitting: Cardiology

## 2018-05-18 ENCOUNTER — Telehealth: Payer: Self-pay | Admitting: Cardiology

## 2018-05-18 DIAGNOSIS — N390 Urinary tract infection, site not specified: Secondary | ICD-10-CM | POA: Diagnosis not present

## 2018-05-18 DIAGNOSIS — E785 Hyperlipidemia, unspecified: Secondary | ICD-10-CM | POA: Diagnosis not present

## 2018-05-18 DIAGNOSIS — N183 Chronic kidney disease, stage 3 (moderate): Secondary | ICD-10-CM | POA: Diagnosis not present

## 2018-05-18 DIAGNOSIS — R002 Palpitations: Secondary | ICD-10-CM | POA: Diagnosis not present

## 2018-05-18 DIAGNOSIS — E118 Type 2 diabetes mellitus with unspecified complications: Secondary | ICD-10-CM | POA: Diagnosis not present

## 2018-05-18 DIAGNOSIS — E1140 Type 2 diabetes mellitus with diabetic neuropathy, unspecified: Secondary | ICD-10-CM | POA: Diagnosis not present

## 2018-05-18 DIAGNOSIS — E538 Deficiency of other specified B group vitamins: Secondary | ICD-10-CM | POA: Diagnosis not present

## 2018-05-18 NOTE — Telephone Encounter (Signed)
YOUR CARDIOLOGY TEAM HAS ARRANGED FOR AN E-VISIT FOR YOUR APPOINTMENT - PLEASE REVIEW IMPORTANT INFORMATION BELOW SEVERAL DAYS PRIOR TO YOUR APPOINTMENT  Due to the recent COVID-19 pandemic, we are transitioning in-person office visits to tele-medicine visits in an effort to decrease unnecessary exposure to our patients and staff. Medicare and most insurances are covering these visits without a copay needed. We also encourage you to sign up for MyChart if you have not already done so. You will need a smartphone if possible. For patients that do not have this, we can still complete the visit using a regular telephone but do prefer a smartphone to enable video when possible. You may have a close family member that lives with you that can help. If possible, we also ask that you have a blood pressure cuff and scale at home to measure your blood pressure, heart rate and weight prior to your scheduled appointment. Patients with clinical needs that need an in-person evaluation and testing will still be able to come to the office if absolutely necessary. If you have any questions, feel free to call our office.  CONSENT FOR TELE-HEALTH VISIT - PLEASE REVIEW  I hereby voluntarily request, consent and authorize [facility] and its employed or contracted physicians, physician assistants, nurse practitioners or other licensed health care professionals (the Practitioner), to provide me with telemedicine health care services (the Services") as deemed necessary by the treating Practitioner. I acknowledge and consent to receive the Services by the Practitioner via telemedicine. I understand that the telemedicine visit will involve communicating with the Practitioner through live audiovisual communication technology and the disclosure of certain medical information by electronic transmission. I acknowledge that I have been given the opportunity to request an in-person assessment or other available alternative prior to the  telemedicine visit and am voluntarily participating in the telemedicine visit.  I understand that I have the right to withhold or withdraw my consent to the use of telemedicine in the course of my care at any time, without affecting my right to future care or treatment, and that the Practitioner or I may terminate the telemedicine visit at any time. I understand that I have the right to inspect all information obtained and/or recorded in the course of the telemedicine visit and may receive copies of available information for a reasonable fee.  I understand that some of the potential risks of receiving the Services via telemedicine include:   Delay or interruption in medical evaluation due to technological equipment failure or disruption;  Information transmitted may not be sufficient (e.g. poor resolution of images) to allow for appropriate medical decision making by the Practitioner; and/or   In rare instances, security protocols could fail, causing a breach of personal health information.  Furthermore, I acknowledge that it is my responsibility to provide information about my medical history, conditions and care that is complete and accurate to the best of my ability. I acknowledge that Practitioner's advice, recommendations, and/or decision may be based on factors not within their control, such as incomplete or inaccurate data provided by me or distortions of diagnostic images or specimens that may result from electronic transmissions. I understand that the practice of medicine is not an exact science and that Practitioner makes no warranties or guarantees regarding treatment outcomes. I acknowledge that I will receive a copy of this consent concurrently upon execution via email to the email address I last provided but may also request a printed copy by calling the office of [facility].  I understand that my insurance will be billed for this visit.   I have read or had this consent read to  me.  I understand the contents of this consent, which adequately explains the benefits and risks of the Services being provided via telemedicine.   I have been provided ample opportunity to ask questions regarding this consent and the Services and have had my questions answered to my satisfaction.  I give my informed consent for the services to be provided through the use of telemedicine in my medical care  By participating in this telemedicine visit I agree to the above. Patient gives verbal consent to do televisit 05/18/2018 pp

## 2018-05-19 ENCOUNTER — Telehealth: Payer: Self-pay

## 2018-05-19 ENCOUNTER — Other Ambulatory Visit: Payer: Self-pay

## 2018-05-19 ENCOUNTER — Encounter: Payer: Self-pay | Admitting: Cardiology

## 2018-05-19 ENCOUNTER — Telehealth (INDEPENDENT_AMBULATORY_CARE_PROVIDER_SITE_OTHER): Payer: Medicare Other | Admitting: Cardiology

## 2018-05-19 VITALS — BP 122/62 | HR 86 | Ht 73.0 in | Wt 181.0 lb

## 2018-05-19 DIAGNOSIS — I2510 Atherosclerotic heart disease of native coronary artery without angina pectoris: Secondary | ICD-10-CM | POA: Diagnosis not present

## 2018-05-19 DIAGNOSIS — E119 Type 2 diabetes mellitus without complications: Secondary | ICD-10-CM

## 2018-05-19 DIAGNOSIS — Z950 Presence of cardiac pacemaker: Secondary | ICD-10-CM

## 2018-05-19 MED ORDER — APIXABAN 2.5 MG PO TABS: 2.5000 mg | ORAL_TABLET | Freq: Two times a day (BID) | ORAL | 2 refills | Status: AC

## 2018-05-19 NOTE — Patient Instructions (Addendum)
RN called patient on 05/22/18 at 1300 and reviewed medication changes and AVS information. Patients daughter would like to call back to scheduled doxy.me f/u visit for 1 mo. No further questions at this time.  Medication Instructions:  START taking Eliquis 2.5 mg (1 tablet) twice daily  If you need a refill on your cardiac medications before your next appointment, please call your pharmacy.   Lab work: NONE If you have labs (blood work) drawn today and your tests are completely normal, you will receive your results only by: Marland Kitchen MyChart Message (if you have MyChart) OR . A paper copy in the mail If you have any lab test that is abnormal or we need to change your treatment, we will call you to review the results.  Testing/Procedures: NONE  Follow-Up: At [facility], you and your health needs are our priority.  As part of our continuing mission to provide you with exceptional heart care, we have created designated Provider Care Teams.  These Care Teams include your primary Cardiologist (physician) and Advanced Practice Providers (APPs -  Physician Assistants and Nurse Practitioners) who all work together to provide you with the care you need, when you need it. You will need a follow up appointment in 1 months.    Any Other Special Instructions Will Be Listed Below Apixaban oral tablets What is this medicine? APIXABAN (a PIX a ban) is an anticoagulant (blood thinner). It is used to lower the chance of stroke in people with a medical condition called atrial fibrillation. It is also used to treat or prevent blood clots in the lungs or in the veins. This medicine may be used for other purposes; ask your health care provider or pharmacist if you have questions. COMMON BRAND NAME(S): Eliquis What should I tell my health care provider before I take this medicine? They need to know if you have any of these conditions: -bleeding disorders -bleeding in the brain -blood in your stools (black or  tarry stools) or if you have blood in your vomit -history of stomach bleeding -kidney disease -liver disease -mechanical heart valve -an unusual or allergic reaction to apixaban, other medicines, foods, dyes, or preservatives -pregnant or trying to get pregnant -breast-feeding How should I use this medicine? Take this medicine by mouth with a glass of water. Follow the directions on the prescription label. You can take it with or without food. If it upsets your stomach, take it with food. Take your medicine at regular intervals. Do not take it more often than directed. Do not stop taking except on your doctor's advice. Stopping this medicine may increase your risk of a blood clot. Be sure to refill your prescription before you run out of medicine. Talk to your pediatrician regarding the use of this medicine in children. Special care may be needed. Overdosage: If you think you have taken too much of this medicine contact a poison control center or emergency room at once. NOTE: This medicine is only for you. Do not share this medicine with others. What if I miss a dose? If you miss a dose, take it as soon as you can. If it is almost time for your next dose, take only that dose. Do not take double or extra doses. What may interact with this medicine? This medicine may interact with the following: -aspirin and aspirin-like medicines -certain medicines for fungal infections like ketoconazole and itraconazole -certain medicines for seizures like carbamazepine and phenytoin -certain medicines that treat or prevent blood clots like warfarin,  enoxaparin, and dalteparin -clarithromycin -NSAIDs, medicines for pain and inflammation, like ibuprofen or naproxen -rifampin -ritonavir -St. John's wort This list may not describe all possible interactions. Give your health care provider a list of all the medicines, herbs, non-prescription drugs, or dietary supplements you use. Also tell them if you smoke,  drink alcohol, or use illegal drugs. Some items may interact with your medicine. What should I watch for while using this medicine? Visit your healthcare professional for regular checks on your progress. You may need blood work done while you are taking this medicine. Your condition will be monitored carefully while you are receiving this medicine. It is important not to miss any appointments. Avoid sports and activities that might cause injury while you are using this medicine. Severe falls or injuries can cause unseen bleeding. Be careful when using sharp tools or knives. Consider using an Neurosurgeonelectric razor. Take special care brushing or flossing your teeth. Report any injuries, bruising, or red spots on the skin to your healthcare professional. If you are going to need surgery or other procedure, tell your healthcare professional that you are taking this medicine. Wear a medical ID bracelet or chain. Carry a card that describes your disease and details of your medicine and dosage times. What side effects may I notice from receiving this medicine? Side effects that you should report to your doctor or health care professional as soon as possible: -allergic reactions like skin rash, itching or hives, swelling of the face, lips, or tongue -signs and symptoms of bleeding such as bloody or black, tarry stools; red or dark-brown urine; spitting up blood or brown material that looks like coffee grounds; red spots on the skin; unusual bruising or bleeding from the eye, gums, or nose -signs and symptoms of a blood clot such as chest pain; shortness of breath; pain, swelling, or warmth in the leg -signs and symptoms of a stroke such as changes in vision; confusion; trouble speaking or understanding; severe headaches; sudden numbness or weakness of the face, arm or leg; trouble walking; dizziness; loss of coordination This list may not describe all possible side effects. Call your doctor for medical advice about  side effects. You may report side effects to FDA at 1-800-FDA-1088. Where should I keep my medicine? Keep out of the reach of children. Store at room temperature between 20 and 25 degrees C (68 and 77 degrees F). Throw away any unused medicine after the expiration date. NOTE: This sheet is a summary. It may not cover all possible information. If you have questions about this medicine, talk to your doctor, pharmacist, or health care provider.  2019 Elsevier/Gold Standard (2017-01-20 11:20:07)

## 2018-05-19 NOTE — Progress Notes (Signed)
Virtual Visit via Video Note   This visit type was conducted due to national recommendations for restrictions regarding the COVID-19 Pandemic (e.g. social distancing) in an effort to limit this patient's exposure and mitigate transmission in our community.  Due to his co-morbid illnesses, this patient is at least at moderate risk for complications without adequate follow up.  This format is felt to be most appropriate for this patient at this time.  All issues noted in this document were discussed and addressed.  A limited physical exam was performed with this format.  Please refer to the patient's chart for his consent to telehealth for [facility].   Evaluation Performed:  Follow-up visit  Date:  05/19/2018   ID:  Gary Kirby, DOB 1933/02/26, MRN [id number]  Patient Location: Home  Provider Location: Home  PCP:  Guadalupe Maple., MD  Cardiologist:  No primary care provider on file.   Electrophysiologist:  None   Chief Complaint:  CAD  History of Present Illness:    Gary Kirby is a 85 y.o. male who presents via audio/video conferencing for a telehealth visit today.    Patient has atherosclerotic vascular disease and he denies any problems at this time and takes care of activities of daily living.  He leads a very sedentary lifestyle.  He complains of generalized fatigue at times.  No other issues.  He saw his primary care physician yesterday and I reviewed those records this revealed him to be in atrial fibrillation.  His ventricular rates were unremarkable and the patient was initiated on Eliquis 2.5 mg by his primary care physicians twice daily.  He was asked to check with me before starting the medication.  The patient does not have symptoms concerning for COVID-19 infection (fever, chills, cough, or new shortness of breath).    Past Medical History:  Diagnosis Date  . Anemia   . Dementia (HCC)   . Diabetes mellitus without complication (HCC)    Type 2  . HOH (hard  of hearing)   . Hyperlipidemia   . Hypertension   . Seizures (HCC)   . Shortness of breath dyspnea    with ambulation  . Sick sinus syndrome (HCC)   . Stroke [facility])    Past Surgical History:  Procedure Laterality Date  . BACK SURGERY    . CARDIAC CATHETERIZATION    . CATARACT EXTRACTION    . CHOLECYSTECTOMY    . COLONOSCOPY    . CORONARY ARTERY BYPASS GRAFT    . ENDARTERECTOMY Right 06/28/2014   Procedure: RIGHT CAROTID ARTERY ENDARTERECTOMY ;  Surgeon: Pryor Ochoa, MD;  Location: [facility] OR;  Service: Vascular;  Laterality: Right;  . PATCH ANGIOPLASTY Right 06/28/2014   Procedure: WITH DACRON PATCH ANGIOPLASTY;  Surgeon: Pryor Ochoa, MD;  Location: [facility] OR;  Service: Vascular;  Laterality: Right;  . ROTATOR CUFF REPAIR Right      Current Meds  Medication Sig  . amLODipine (NORVASC) 10 MG tablet Take 10 mg by mouth daily.   Marland Kitchen aspirin EC 81 MG tablet Take 162 mg by mouth daily.  . DULoxetine (CYMBALTA) 30 MG capsule Take by mouth daily.  . folic acid (FOLVITE) 1 MG tablet Take 1 mg by mouth daily.  Marland Kitchen glipiZIDE (GLUCOTROL) 5 MG tablet Take 5 mg by mouth daily before breakfast.   . isosorbide mononitrate (IMDUR) 60 MG 24 hr tablet Take 60 mg by mouth 2 (two) times daily.   Marland Kitchen JANUVIA 100 MG tablet Take 100  mg by mouth daily.  Marland Kitchen levETIRAcetam (KEPPRA) 750 MG tablet Take 750 mg by mouth 2 (two) times daily.  . metoprolol succinate (TOPROL-XL) 100 MG 24 hr tablet Take 100 mg by mouth daily.   . mirtazapine (REMERON) 15 MG tablet TAKE 1 (ONE) TABLET BY MOUTH EVERYNIGHT AS NEEDED FOR SLEEP  . nitroGLYCERIN (NITROSTAT) 0.4 MG SL tablet Place 0.4 mg under the tongue every 5 (five) minutes as needed for chest pain.  Marland Kitchen ondansetron (ZOFRAN-ODT) 4 MG disintegrating tablet DISSOLVE 1 (ONE) TABLET ON TONGUE EVERY FOUR-SIX HOURS AS NEEDED  . pravastatin (PRAVACHOL) 80 MG tablet Take 80 mg by mouth daily.  . quinapril (ACCUPRIL) 40 MG tablet Take 40 mg by mouth daily.  Marland Kitchen rOPINIRole (REQUIP) 1 MG  tablet TAKE 1-4 TABLET BY MOUTH EVERYNIGHT AT BEDTIME  . tamsulosin (FLOMAX) 0.4 MG CAPS capsule Take 1 capsule by mouth daily.     Allergies:   Patient has no known allergies.   Social History   Tobacco Use  . Smoking status: Never Smoker  . Smokeless tobacco: Current User    Types: Snuff  Substance Use Topics  . Alcohol use: No    Alcohol/week: 0.0 standard drinks  . Drug use: No     Family Hx: The patient's family history includes Cancer in his brother; Diabetes in his brother; Heart attack in his father; Heart disease in his brother and mother; Hyperlipidemia in his brother.  ROS:   Please see the history of present illness.    No problems other than fatigue All other systems reviewed and are negative.   Prior CV studies:   The following studies were reviewed today:  Patient denies any history of chest pain orthopnea or PND  Labs/Other Tests and Data Reviewed:    EKG: EKG was reviewed and revealed atrial fibrillation with well-controlled ventricular rate.  This was done by his primary care physician. Recent Labs: No results found for requested labs within last 8760 hours.   Recent Lipid Panel No results found for: CHOL, TRIG, HDL, CHOLHDL, LDLCALC, LDLDIRECT  Wt Readings from Last 3 Encounters:  05/19/18 181 lb (82.1 kg)  03/03/18 180 lb (81.6 kg)  02/22/18 185 lb (83.9 kg)     Objective:    Vital Signs:  BP 122/62 (BP Location: Left Arm, Patient Position: Sitting, Cuff Size: Normal)   Pulse (!) 140   Ht 6\' 1"  (1.854 m)   Wt 181 lb (82.1 kg)   BMI 23.88 kg/m    Well nourished, well developed male in no acute distress. It was comfortable and in no distress  ASSESSMENT & PLAN:    1. Atrial fibrillation persistent: I discussed with the patient atrial fibrillation, disease process. Management and therapy including rate and rhythm control, anticoagulation benefits and potential risks were discussed extensively with the patient. Patient had multiple  questions which were answered to patient's satisfaction.  Patient has been okayed to take Eliquis twice daily.  Benefits and potential risks were explained and he vocalized understanding and questions were answered to his family satisfaction. 2. Secondary prevention stressed with the patient.  Importance of compliance with diet and medication stressed and he vocalized understanding. 3. His blood pressure is generally stable.  His lipids will be followed by his primary care physician.  COVID-19 Education: The signs and symptoms of COVID-19 were discussed with the patient and how to seek care for testing (follow up with PCP or arrange E-visit).  The importance of social distancing was discussed today.  Time:  Today, I have spent 17 minutes with the patient with telehealth technology discussing the above problems.     Medication Adjustments/Labs and Tests Ordered: Current medicines are reviewed at length with the patient today.  Concerns regarding medicines are outlined above.  Tests Ordered: No orders of the defined types were placed in this encounter.  Medication Changes: No orders of the defined types were placed in this encounter.   Disposition:  Follow up in 1 month(s)  Signed, Garwin Brothersajan R Zenith Lamphier, MD  05/19/2018 9:47 AM    [facility]

## 2018-05-19 NOTE — Addendum Note (Signed)
Addended by: Pamala Hurry on: 05/19/2018 04:51 PM   Modules accepted: Orders

## 2018-05-19 NOTE — Telephone Encounter (Signed)
Medication dosing for Eliquis. Pharmacy recommends 2.5 mg twice daily because he is greater than 83 years old. Patient will be advised to start medication with AVS instructions per Dr.RRR.

## 2018-05-22 MED ORDER — ASPIRIN EC 81 MG PO TBEC: 81.0000 mg | DELAYED_RELEASE_TABLET | Freq: Every day | ORAL | 1 refills | Status: AC

## 2018-05-22 NOTE — Addendum Note (Signed)
Addended by: Pamala Hurry on: 05/22/2018 01:05 PM   Modules accepted: Orders

## 2018-05-29 DIAGNOSIS — I4891 Unspecified atrial fibrillation: Secondary | ICD-10-CM | POA: Diagnosis not present

## 2018-05-29 DIAGNOSIS — N390 Urinary tract infection, site not specified: Secondary | ICD-10-CM | POA: Diagnosis not present

## 2018-05-29 DIAGNOSIS — I10 Essential (primary) hypertension: Secondary | ICD-10-CM | POA: Diagnosis not present

## 2018-05-29 DIAGNOSIS — I2510 Atherosclerotic heart disease of native coronary artery without angina pectoris: Secondary | ICD-10-CM | POA: Diagnosis not present

## 2018-06-07 ENCOUNTER — Telehealth: Payer: Self-pay | Admitting: *Deleted

## 2018-06-08 NOTE — Telephone Encounter (Signed)
Called patient to obtain device information.   Daughter states card has 3 SN's and 3 MN's   SN's [id number] Rexene Edison, [id number], [id number] V  MN's [id number], [id number], [id number]

## 2018-06-09 NOTE — Telephone Encounter (Signed)
Transmitter Model [id number] SN [id number]

## 2018-06-09 NOTE — Telephone Encounter (Signed)
Pt is in paceart. Already

## 2018-06-14 ENCOUNTER — Telehealth: Payer: Medicare Other | Admitting: Cardiology

## 2018-06-14 ENCOUNTER — Telehealth (INDEPENDENT_AMBULATORY_CARE_PROVIDER_SITE_OTHER): Payer: Medicare Other | Admitting: Cardiology

## 2018-06-14 ENCOUNTER — Other Ambulatory Visit: Payer: Self-pay

## 2018-06-14 ENCOUNTER — Encounter: Payer: Self-pay | Admitting: Cardiology

## 2018-06-14 VITALS — BP 135/68 | Ht 73.0 in | Wt 189.0 lb

## 2018-06-14 DIAGNOSIS — I480 Paroxysmal atrial fibrillation: Secondary | ICD-10-CM | POA: Diagnosis not present

## 2018-06-14 DIAGNOSIS — Z951 Presence of aortocoronary bypass graft: Secondary | ICD-10-CM

## 2018-06-14 DIAGNOSIS — Z950 Presence of cardiac pacemaker: Secondary | ICD-10-CM

## 2018-06-14 DIAGNOSIS — E119 Type 2 diabetes mellitus without complications: Secondary | ICD-10-CM

## 2018-06-14 DIAGNOSIS — I2510 Atherosclerotic heart disease of native coronary artery without angina pectoris: Secondary | ICD-10-CM

## 2018-06-14 DIAGNOSIS — Z794 Long term (current) use of insulin: Secondary | ICD-10-CM

## 2018-06-14 NOTE — Patient Instructions (Signed)

## 2018-06-14 NOTE — Patient Outreach (Signed)
[facility]) Care Management  06/14/2018  Gary Kirby Jul 01, 1933 [id number]   Medication Adherence call to Mr. [name] Hippa Identifiers Verify spoke with patient he is due on Pravastatin 80 mg and Quinapril 40 mg patient explain he is still taking 1 tablet daily on both he has already order both medication from [facility] and will pick up today Mr. Remedios is showing past due under [facility] Ins.   Lillia Abed CPhT Pharmacy Technician [facility] Management Direct Dial [phone]  Fax [phone] [email or web address] [email or web address]

## 2018-06-14 NOTE — Progress Notes (Signed)
Virtual Visit via Video Note   This visit type was conducted due to national recommendations for restrictions regarding the COVID-19 Pandemic (e.g. social distancing) in an effort to limit this patient's exposure and mitigate transmission in our community.  Due to his co-morbid illnesses, this patient is at least at moderate risk for complications without adequate follow up.  This format is felt to be most appropriate for this patient at this time.  All issues noted in this document were discussed and addressed.  A limited physical exam was performed with this format.  Please refer to the patient's chart for his consent to telehealth for [facility].   Date:  06/14/2018   ID:  Gary SavoyBilly J Kirby, DOB 12/15/1933, MRN [id number]  Patient Location: Home Provider Location: Home  PCP:  Guadalupe MapleGage, John F., MD  Cardiologist:  No primary care provider on file.  Electrophysiologist:  None   Evaluation Performed:  Follow-Up Visit  Chief Complaint: Atrial fibrillation  History of Present Illness:    Gary Kirby is a 85 y.o. male with past medical history of coronary artery disease post CABG surgery, essential hypertension, dyslipidemia and atrial fibrillation.  He was initiated on anticoagulation and he tells me that he tolerates it well.  No chest pain orthopnea or PND.  No bleeding from any part of the body.  He is very comfortable taking the medicine.  I asked him if he had any dark stool and he answered in the negative.  At the time of my evaluation, the patient is alert awake oriented and in no distress.  The patient does not have symptoms concerning for COVID-19 infection (fever, chills, cough, or new shortness of breath).    Past Medical History:  Diagnosis Date  . Anemia   . Dementia (HCC)   . Diabetes mellitus without complication (HCC)    Type 2  . HOH (hard of hearing)   . Hyperlipidemia   . Hypertension   . Seizures (HCC)   . Shortness of breath dyspnea    with ambulation  .  Sick sinus syndrome (HCC)   . Stroke [facility](HCC)    Past Surgical History:  Procedure Laterality Date  . BACK SURGERY    . CARDIAC CATHETERIZATION    . CATARACT EXTRACTION    . CHOLECYSTECTOMY    . COLONOSCOPY    . CORONARY ARTERY BYPASS GRAFT    . ENDARTERECTOMY Right 06/28/2014   Procedure: RIGHT CAROTID ARTERY ENDARTERECTOMY ;  Surgeon: Pryor OchoaJames D Lawson, MD;  [facility];  Service: Vascular;  Laterality: Right;  . PATCH ANGIOPLASTY Right 06/28/2014   Procedure: WITH DACRON PATCH ANGIOPLASTY;  Surgeon: Pryor OchoaJames D Lawson, MD;  [facility];  Service: Vascular;  Laterality: Right;  . ROTATOR CUFF REPAIR Right      Current Meds  Medication Sig  . amLODipine (NORVASC) 10 MG tablet Take 10 mg by mouth daily.   Marland Kitchen. apixaban (ELIQUIS) 2.5 MG TABS tablet Take 1 tablet (2.5 mg total) by mouth 2 (two) times daily.  Marland Kitchen. aspirin EC 81 MG tablet Take 1 tablet (81 mg total) by mouth daily.  . DULoxetine (CYMBALTA) 30 MG capsule Take 30 mg by mouth daily.   . folic acid (FOLVITE) 1 MG tablet Take 1 mg by mouth daily.  Marland Kitchen. glipiZIDE (GLUCOTROL) 5 MG tablet Take 5 mg by mouth daily before breakfast.   . isosorbide mononitrate (IMDUR) 60 MG 24 hr tablet Take 60 mg by mouth 2 (two) times daily.   Marland Kitchen. JANUVIA  100 MG tablet Take 100 mg by mouth daily.  Marland Kitchen levETIRAcetam (KEPPRA) 750 MG tablet Take 750 mg by mouth 2 (two) times daily.  . metoprolol succinate (TOPROL-XL) 100 MG 24 hr tablet Take 100 mg by mouth daily.   . mirtazapine (REMERON) 15 MG tablet TAKE 1 (ONE) TABLET BY MOUTH EVERYNIGHT AS NEEDED FOR SLEEP  . nitroGLYCERIN (NITROSTAT) 0.4 MG SL tablet Place 0.4 mg under the tongue every 5 (five) minutes as needed for chest pain.  Marland Kitchen ondansetron (ZOFRAN-ODT) 4 MG disintegrating tablet DISSOLVE 1 (ONE) TABLET ON TONGUE EVERY FOUR-SIX HOURS AS NEEDED  . pravastatin (PRAVACHOL) 80 MG tablet Take 80 mg by mouth daily.  . quinapril (ACCUPRIL) 40 MG tablet Take 40 mg by mouth daily.  Marland Kitchen rOPINIRole (REQUIP) 1 MG tablet  TAKE 1-4 TABLET BY MOUTH EVERYNIGHT AT BEDTIME  . tamsulosin (FLOMAX) 0.4 MG CAPS capsule Take 1 capsule by mouth daily.     Allergies:   Patient has no known allergies.   Social History   Tobacco Use  . Smoking status: Never Smoker  . Smokeless tobacco: Current User    Types: Snuff  Substance Use Topics  . Alcohol use: No    Alcohol/week: 0.0 standard drinks  . Drug use: No     Family Hx: The patient's family history includes Cancer in his brother; Diabetes in his brother; Heart attack in his father; Heart disease in his brother and mother; Hyperlipidemia in his brother.  ROS:   Please see the history of present illness.    As mentioned above All other systems reviewed and are negative.   Prior CV studies:   The following studies were reviewed today:  None  Labs/Other Tests and Data Reviewed:    EKG:  No ECG reviewed.  Recent Labs: No results found for requested labs within last 8760 hours.   Recent Lipid Panel No results found for: CHOL, TRIG, HDL, CHOLHDL, LDLCALC, LDLDIRECT  Wt Readings from Last 3 Encounters:  06/14/18 189 lb (85.7 kg)  05/19/18 181 lb (82.1 kg)  03/03/18 180 lb (81.6 kg)     Objective:    Vital Signs:  BP 135/68 (BP Location: Left Arm, Patient Position: Sitting, Cuff Size: Normal)   Ht 6\' 1"  (1.854 m)   Wt 189 lb (85.7 kg)   BMI 24.94 kg/m    VITAL SIGNS:  reviewed  ASSESSMENT & PLAN:    1. Atrial fibrillation:I discussed with the patient atrial fibrillation, disease process. Management and therapy including rate and rhythm control, anticoagulation benefits and potential risks were discussed extensively with the patient. Patient had multiple questions which were answered to patient's satisfaction. 2. Essential hypertension: Blood pressure is stable at this time 3. Coronary artery disease: Secondary prevention stressed with the patient.  Importance of compliance with diet and medication stressed and he vocalized understanding.  He  will be seen in follow-up appointment in 3 months or earlier if he has any concerns.  COVID-19 Education: The signs and symptoms of COVID-19 were discussed with the patient and how to seek care for testing (follow up with PCP or arrange E-visit).  The importance of social distancing was discussed today.  Time:   Today, I have spent 17 minutes with the patient with telehealth technology discussing the above problems.     Medication Adjustments/Labs and Tests Ordered: Current medicines are reviewed at length with the patient today.  Concerns regarding medicines are outlined above.   Tests Ordered: No orders of the defined types were placed  in this encounter.   Medication Changes: No orders of the defined types were placed in this encounter.   Disposition:  Follow up in 3 month(s)  Signed, Garwin Brothers, MD  06/14/2018 2:46 PM    [facility]

## 2018-07-04 ENCOUNTER — Encounter: Payer: Medicare Other | Admitting: *Deleted

## 2018-07-05 ENCOUNTER — Telehealth: Payer: Self-pay

## 2018-07-05 NOTE — Telephone Encounter (Signed)
Left message for patient to remind of missed remote transmission.  

## 2018-07-31 DIAGNOSIS — R509 Fever, unspecified: Secondary | ICD-10-CM | POA: Diagnosis not present

## 2018-07-31 DIAGNOSIS — R300 Dysuria: Secondary | ICD-10-CM | POA: Diagnosis not present

## 2018-07-31 DIAGNOSIS — N390 Urinary tract infection, site not specified: Secondary | ICD-10-CM | POA: Diagnosis not present

## 2018-08-14 ENCOUNTER — Telehealth: Payer: Self-pay | Admitting: *Deleted

## 2018-08-14 NOTE — Telephone Encounter (Signed)
LVMTCB Calling patient to schedule virtual appointment possibly on 7/13 with Dr. Curt Bears

## 2018-08-17 DIAGNOSIS — E785 Hyperlipidemia, unspecified: Secondary | ICD-10-CM | POA: Diagnosis not present

## 2018-08-17 DIAGNOSIS — I2510 Atherosclerotic heart disease of native coronary artery without angina pectoris: Secondary | ICD-10-CM | POA: Diagnosis not present

## 2018-08-17 DIAGNOSIS — E1140 Type 2 diabetes mellitus with diabetic neuropathy, unspecified: Secondary | ICD-10-CM | POA: Diagnosis not present

## 2018-08-17 DIAGNOSIS — E118 Type 2 diabetes mellitus with unspecified complications: Secondary | ICD-10-CM | POA: Diagnosis not present

## 2018-08-20 ENCOUNTER — Inpatient Hospital Stay (HOSPITAL_COMMUNITY)
Admission: AD | Admit: 2018-08-20 | Discharge: 2018-09-09 | DRG: 177 | Disposition: E | Payer: Medicare Other | Source: Other Acute Inpatient Hospital | Attending: Pulmonary Disease | Admitting: Pulmonary Disease

## 2018-08-20 ENCOUNTER — Inpatient Hospital Stay (HOSPITAL_COMMUNITY): Payer: Medicare Other

## 2018-08-20 DIAGNOSIS — Z950 Presence of cardiac pacemaker: Secondary | ICD-10-CM

## 2018-08-20 DIAGNOSIS — A419 Sepsis, unspecified organism: Secondary | ICD-10-CM | POA: Diagnosis not present

## 2018-08-20 DIAGNOSIS — R918 Other nonspecific abnormal finding of lung field: Secondary | ICD-10-CM | POA: Diagnosis not present

## 2018-08-20 DIAGNOSIS — Z8249 Family history of ischemic heart disease and other diseases of the circulatory system: Secondary | ICD-10-CM

## 2018-08-20 DIAGNOSIS — Z951 Presence of aortocoronary bypass graft: Secondary | ICD-10-CM

## 2018-08-20 DIAGNOSIS — E785 Hyperlipidemia, unspecified: Secondary | ICD-10-CM | POA: Diagnosis present

## 2018-08-20 DIAGNOSIS — Z8673 Personal history of transient ischemic attack (TIA), and cerebral infarction without residual deficits: Secondary | ICD-10-CM

## 2018-08-20 DIAGNOSIS — J988 Other specified respiratory disorders: Secondary | ICD-10-CM | POA: Diagnosis not present

## 2018-08-20 DIAGNOSIS — A4189 Other specified sepsis: Secondary | ICD-10-CM | POA: Diagnosis not present

## 2018-08-20 DIAGNOSIS — N390 Urinary tract infection, site not specified: Secondary | ICD-10-CM | POA: Diagnosis not present

## 2018-08-20 DIAGNOSIS — J1289 Other viral pneumonia: Secondary | ICD-10-CM | POA: Diagnosis present

## 2018-08-20 DIAGNOSIS — J80 Acute respiratory distress syndrome: Secondary | ICD-10-CM | POA: Diagnosis not present

## 2018-08-20 DIAGNOSIS — N179 Acute kidney failure, unspecified: Secondary | ICD-10-CM | POA: Diagnosis present

## 2018-08-20 DIAGNOSIS — I480 Paroxysmal atrial fibrillation: Secondary | ICD-10-CM | POA: Diagnosis present

## 2018-08-20 DIAGNOSIS — I2510 Atherosclerotic heart disease of native coronary artery without angina pectoris: Secondary | ICD-10-CM | POA: Diagnosis not present

## 2018-08-20 DIAGNOSIS — F0390 Unspecified dementia without behavioral disturbance: Secondary | ICD-10-CM | POA: Diagnosis present

## 2018-08-20 DIAGNOSIS — Z66 Do not resuscitate: Secondary | ICD-10-CM | POA: Diagnosis not present

## 2018-08-20 DIAGNOSIS — I495 Sick sinus syndrome: Secondary | ICD-10-CM | POA: Diagnosis not present

## 2018-08-20 DIAGNOSIS — F17290 Nicotine dependence, other tobacco product, uncomplicated: Secondary | ICD-10-CM | POA: Diagnosis present

## 2018-08-20 DIAGNOSIS — Z7984 Long term (current) use of oral hypoglycemic drugs: Secondary | ICD-10-CM | POA: Diagnosis not present

## 2018-08-20 DIAGNOSIS — I959 Hypotension, unspecified: Secondary | ICD-10-CM | POA: Diagnosis not present

## 2018-08-20 DIAGNOSIS — U071 COVID-19: Secondary | ICD-10-CM | POA: Diagnosis not present

## 2018-08-20 DIAGNOSIS — Z7982 Long term (current) use of aspirin: Secondary | ICD-10-CM

## 2018-08-20 DIAGNOSIS — I129 Hypertensive chronic kidney disease with stage 1 through stage 4 chronic kidney disease, or unspecified chronic kidney disease: Secondary | ICD-10-CM | POA: Diagnosis not present

## 2018-08-20 DIAGNOSIS — Z833 Family history of diabetes mellitus: Secondary | ICD-10-CM | POA: Diagnosis not present

## 2018-08-20 DIAGNOSIS — Z515 Encounter for palliative care: Secondary | ICD-10-CM | POA: Diagnosis not present

## 2018-08-20 DIAGNOSIS — J9601 Acute respiratory failure with hypoxia: Secondary | ICD-10-CM | POA: Diagnosis not present

## 2018-08-20 DIAGNOSIS — E1122 Type 2 diabetes mellitus with diabetic chronic kidney disease: Secondary | ICD-10-CM | POA: Diagnosis present

## 2018-08-20 DIAGNOSIS — R0602 Shortness of breath: Secondary | ICD-10-CM | POA: Diagnosis not present

## 2018-08-20 DIAGNOSIS — R569 Unspecified convulsions: Secondary | ICD-10-CM | POA: Diagnosis not present

## 2018-08-20 DIAGNOSIS — I4891 Unspecified atrial fibrillation: Secondary | ICD-10-CM | POA: Diagnosis not present

## 2018-08-20 DIAGNOSIS — R6520 Severe sepsis without septic shock: Secondary | ICD-10-CM | POA: Diagnosis not present

## 2018-08-20 DIAGNOSIS — N183 Chronic kidney disease, stage 3 (moderate): Secondary | ICD-10-CM | POA: Diagnosis present

## 2018-08-20 DIAGNOSIS — Z7901 Long term (current) use of anticoagulants: Secondary | ICD-10-CM | POA: Diagnosis not present

## 2018-08-20 DIAGNOSIS — E1165 Type 2 diabetes mellitus with hyperglycemia: Secondary | ICD-10-CM | POA: Diagnosis not present

## 2018-08-20 DIAGNOSIS — R069 Unspecified abnormalities of breathing: Secondary | ICD-10-CM | POA: Diagnosis not present

## 2018-08-20 LAB — CBC WITH DIFFERENTIAL/PLATELET
Abs Immature Granulocytes: 0.11 10*3/uL — ABNORMAL HIGH (ref 0.00–0.07)
Basophils Absolute: 0 10*3/uL (ref 0.0–0.1)
Basophils Relative: 0 %
Eosinophils Absolute: 0 10*3/uL (ref 0.0–0.5)
Eosinophils Relative: 0 %
HCT: 26.8 % — ABNORMAL LOW (ref 39.0–52.0)
Hemoglobin: 8.2 g/dL — ABNORMAL LOW (ref 13.0–17.0)
Immature Granulocytes: 1 %
Lymphocytes Relative: 3 %
Lymphs Abs: 0.5 10*3/uL — ABNORMAL LOW (ref 0.7–4.0)
MCH: 30.8 pg (ref 26.0–34.0)
MCHC: 30.6 g/dL (ref 30.0–36.0)
MCV: 100.8 fL — ABNORMAL HIGH (ref 80.0–100.0)
Monocytes Absolute: 0.2 10*3/uL (ref 0.1–1.0)
Monocytes Relative: 1 %
Neutro Abs: 15.1 10*3/uL — ABNORMAL HIGH (ref 1.7–7.7)
Neutrophils Relative %: 95 %
Platelets: 216 10*3/uL (ref 150–400)
RBC: 2.66 MIL/uL — ABNORMAL LOW (ref 4.22–5.81)
RDW: 14.8 % (ref 11.5–15.5)
WBC: 15.9 10*3/uL — ABNORMAL HIGH (ref 4.0–10.5)
nRBC: 0 % (ref 0.0–0.2)

## 2018-08-20 LAB — COMPREHENSIVE METABOLIC PANEL
ALT: 20 U/L (ref 0–44)
AST: 35 U/L (ref 15–41)
Albumin: 2.8 g/dL — ABNORMAL LOW (ref 3.5–5.0)
Alkaline Phosphatase: 66 U/L (ref 38–126)
Anion gap: 16 — ABNORMAL HIGH (ref 5–15)
BUN: 66 mg/dL — ABNORMAL HIGH (ref 8–23)
CO2: 16 mmol/L — ABNORMAL LOW (ref 22–32)
Calcium: 8.3 mg/dL — ABNORMAL LOW (ref 8.9–10.3)
Chloride: 103 mmol/L (ref 98–111)
Creatinine, Ser: 3.67 mg/dL — ABNORMAL HIGH (ref 0.61–1.24)
GFR calc Af Amer: 17 mL/min — ABNORMAL LOW (ref >60)
GFR calc non Af Amer: 14 mL/min — ABNORMAL LOW (ref >60)
Glucose, Bld: 308 mg/dL — ABNORMAL HIGH (ref 70–99)
Potassium: 4.6 mmol/L (ref 3.5–5.1)
Sodium: 135 mmol/L (ref 135–145)
Total Bilirubin: 0.8 mg/dL (ref 0.3–1.2)
Total Protein: 6.6 g/dL (ref 6.5–8.1)

## 2018-08-20 LAB — BLOOD GAS, ARTERIAL
Acid-base deficit: 5.8 mmol/L — ABNORMAL HIGH (ref 0.0–2.0)
Bicarbonate: 18.5 mmol/L — ABNORMAL LOW (ref 20.0–28.0)
Drawn by: 350431
FIO2: 1
O2 Content: 5.5 L/min
O2 Saturation: 64.4 %
Patient temperature: 98.6
RATE: 18 resp/min
pCO2 arterial: 33.1 mmHg (ref 32.0–48.0)
pH, Arterial: 7.367 (ref 7.350–7.450)
pO2, Arterial: 39.4 mmHg — CL (ref 83.0–108.0)

## 2018-08-20 LAB — HEMOGLOBIN A1C
Hgb A1c MFr Bld: 7.5 % — ABNORMAL HIGH (ref 4.8–5.6)
Mean Plasma Glucose: 168.55 mg/dL

## 2018-08-20 LAB — ABO/RH: ABO/RH(D): O POS

## 2018-08-20 LAB — LACTATE DEHYDROGENASE: LDH: 285 U/L — ABNORMAL HIGH (ref 98–192)

## 2018-08-20 LAB — GLUCOSE, CAPILLARY: Glucose-Capillary: 282 mg/dL — ABNORMAL HIGH (ref 70–99)

## 2018-08-20 LAB — TROPONIN I (HIGH SENSITIVITY): Troponin I (High Sensitivity): 11 ng/L (ref <18)

## 2018-08-20 LAB — FERRITIN: Ferritin: 325 ng/mL (ref 24–336)

## 2018-08-20 LAB — TRIGLYCERIDES: Triglycerides: 60 mg/dL (ref <150)

## 2018-08-20 LAB — LACTIC ACID, PLASMA: Lactic Acid, Venous: 1.8 mmol/L (ref 0.5–1.9)

## 2018-08-20 LAB — C-REACTIVE PROTEIN: CRP: 28.4 mg/dL — ABNORMAL HIGH (ref <1.0)

## 2018-08-20 MED ORDER — SODIUM CHLORIDE 0.9 % IV SOLN
100.0000 mg | INTRAVENOUS | Status: DC
  Administered 2018-08-21: 100 mg via INTRAVENOUS
  Filled 2018-08-20 (×3): qty 20

## 2018-08-20 MED ORDER — SODIUM CHLORIDE 0.9 % IV SOLN
1.0000 g | INTRAVENOUS | Status: DC
  Administered 2018-08-20 – 2018-08-21 (×2): 1 g via INTRAVENOUS
  Filled 2018-08-20: qty 1
  Filled 2018-08-20 (×2): qty 10

## 2018-08-20 MED ORDER — INSULIN ASPART 100 UNIT/ML ~~LOC~~ SOLN
0.0000 [IU] | Freq: Three times a day (TID) | SUBCUTANEOUS | Status: DC
  Administered 2018-08-21: 5 [IU] via SUBCUTANEOUS
  Administered 2018-08-21 (×2): 8 [IU] via SUBCUTANEOUS

## 2018-08-20 MED ORDER — DULOXETINE HCL 30 MG PO CPEP
30.0000 mg | ORAL_CAPSULE | Freq: Every day | ORAL | Status: DC
  Administered 2018-08-20 – 2018-08-21 (×2): 30 mg via ORAL
  Filled 2018-08-20 (×3): qty 1

## 2018-08-20 MED ORDER — SODIUM CHLORIDE 0.9 % IV SOLN
200.0000 mg | Freq: Once | INTRAVENOUS | Status: AC
  Administered 2018-08-20: 200 mg via INTRAVENOUS
  Filled 2018-08-20: qty 40

## 2018-08-20 MED ORDER — AMLODIPINE BESYLATE 10 MG PO TABS: 10.0000 mg | ORAL_TABLET | Freq: Every day | ORAL | Status: DC

## 2018-08-20 MED ORDER — APIXABAN 2.5 MG PO TABS
2.5000 mg | ORAL_TABLET | Freq: Two times a day (BID) | ORAL | Status: DC
  Administered 2018-08-20: 2.5 mg via ORAL
  Filled 2018-08-20 (×2): qty 1

## 2018-08-20 MED ORDER — LEVETIRACETAM 750 MG PO TABS
750.0000 mg | ORAL_TABLET | Freq: Two times a day (BID) | ORAL | Status: DC
  Administered 2018-08-20 – 2018-08-21 (×3): 750 mg via ORAL
  Filled 2018-08-20 (×4): qty 1

## 2018-08-20 MED ORDER — INSULIN ASPART 100 UNIT/ML ~~LOC~~ SOLN
0.0000 [IU] | Freq: Every day | SUBCUTANEOUS | Status: DC
  Administered 2018-08-20: 3 [IU] via SUBCUTANEOUS
  Administered 2018-08-21: 2 [IU] via SUBCUTANEOUS

## 2018-08-20 MED ORDER — ROPINIROLE HCL 1 MG PO TABS
1.0000 mg | ORAL_TABLET | Freq: Every evening | ORAL | Status: DC | PRN
  Administered 2018-08-21: 1 mg via ORAL
  Filled 2018-08-20 (×2): qty 1

## 2018-08-20 MED ORDER — ASPIRIN EC 81 MG PO TBEC
81.0000 mg | DELAYED_RELEASE_TABLET | Freq: Every day | ORAL | Status: DC
  Administered 2018-08-20 – 2018-08-21 (×2): 81 mg via ORAL
  Filled 2018-08-20 (×2): qty 1

## 2018-08-20 MED ORDER — MIRTAZAPINE 15 MG PO TABS
15.0000 mg | ORAL_TABLET | Freq: Every day | ORAL | Status: DC
  Administered 2018-08-20 – 2018-08-21 (×2): 15 mg via ORAL
  Filled 2018-08-20 (×2): qty 1

## 2018-08-20 MED ORDER — DEXAMETHASONE SODIUM PHOSPHATE 10 MG/ML IJ SOLN
6.0000 mg | INTRAMUSCULAR | Status: DC
  Administered 2018-08-20 – 2018-08-21 (×2): 6 mg via INTRAVENOUS
  Filled 2018-08-20 (×2): qty 0.6

## 2018-08-20 MED ORDER — SODIUM CHLORIDE 0.9 % IV SOLN
500.0000 mg | INTRAVENOUS | Status: DC
  Administered 2018-08-21 (×2): 500 mg via INTRAVENOUS
  Filled 2018-08-20 (×3): qty 500

## 2018-08-20 MED ORDER — METOPROLOL SUCCINATE ER 100 MG PO TB24
100.0000 mg | ORAL_TABLET | Freq: Every day | ORAL | Status: DC
  Administered 2018-08-21: 100 mg via ORAL
  Filled 2018-08-20 (×2): qty 1

## 2018-08-20 MED ORDER — LACTATED RINGERS IV SOLN
INTRAVENOUS | Status: DC
  Administered 2018-08-20: 23:00:00 via INTRAVENOUS

## 2018-08-20 NOTE — H&P (Addendum)
NAME:  Gary SavoyBilly J Brisk, MRN:  [id number], DOB:  [birth year], LOS: 0 ADMISSION DATE:  (Not on file), CONSULTATION DATE:  Mar 15, 2018  CHIEF COMPLAINT:  SOB  Brief History   85 yo transferred to ICU for hypoxic respiratory failure  History of present illness   This is an 85 yo male with history of PAF, pacemaker, comlpete heart block, CABG,  CKD3, CVA, seizures, and DM2 who presents for transfer for hypoxic respiratory failure 2/2 ARDS. H and P is limited by patients mild confusion. And unable to contact daughter with whom he lives at this time. Patient notes he has felt SOB for appx 2 months. As of the last few days he has been having cough and intermittent fevers. He notes decreased PO intake. No diarrhea or vomiting. Brought in to hospital because he could not walk to the bathroom without assistance do to severe SOB. This is new for him. No swelling noted. No increased sputum production. He is not a smoker. He has never been diagnosed with COPD. Has significant heart history but unaware of details. Not sure of what medications he is taking either.   [facility] Noted to have had 2 weeks of feeling SOB. Noted to have UTI at PCP when seen  2 weeks ago. Noted to continue to have SOB and brought into ED. EMS noted patient to be sating at 55% on RA. Not on oxygen at home. In ED noted to be 60% on 15 liters non rebreather. Covid drawn and positive. BMP notable for creatinine of 3.30. CXR noted to have bilateral airspace opacities consistent with pneumonia. DDIMER [id number]. PT INR 1.2ABG with PAO2 of 38.LFT's normal and CRP of 382. Trop less than 0.01. LA 3.1. UA with negative LE and nitrites. SARS COV2 -Positive  Past Medical History  PAF PM Heart block CABG DM2 ??? COPD. Patient denies this and smoking history.   Significant Hospital Events   Admitted to ICU on Mar 15, 2018.   Consults:  NA  Procedures:  NA  Significant Diagnostic Tests:  SARS COV2-positive  Micro Data:  Positive PCR  of COVID BCX collected on 7/12  Antimicrobials:  CTX Azithromycin  Interim history/subjective:  NA  Objective   There were no vitals taken for this visit. PAP: ()/()      No intake or output data in the 24 hours ending 0Feb 05, 2020 2045 There were no vitals filed for this visit.  Examination: General: In no distress. Moving extremities spontaneously  HENT: Patient with no abnormalities Lungs: With crackles in bilateral lower lobes. Patient with sats in 80's but minimal WOB. Able to speak in full sentences and RR high teens low 20's Cardiovascular: Regular. No rubs or murmurs noted. No Edema noted Abdomen: Soft. Non tender non distended Extremities: With no gross abnormalities Neuro: Able to answer questions appropriately oriented to self and place. Not to time GU: No abnormalities noted  Resolved Hospital Problem list   NA  Assessment & Plan:  This is a 85 yo male with history as noted above who present for hypoxic respiratory failure. Overall seem to be doing well.   Hypoxic respiratory failure 2/2 COVID ARDS -CXR here -Remdesivir -Decardon 6 mg daily for next 10 days -Risk stratification labs -On AC for A fib that will help with prophylaxis of thrombotic events -Could attain CTA of chest however do not think this would change management. Will start heparin drip either way if D DIMer continue to elevate.  -Diuresis once patient stabilized. Gentle fluids given  elevated lactate and decreased PO intake.  -Patient instructed on self proning -CAP Coverage -Tolerating sats in low 80's as patient with minimal WOB at this time. May transition temporarily to CPAP to help recruit   A-fib- -Continue Apixaban -Holding metoprolol as pressure marginal -However if increase in D Dimer would have low threshold to start heparin (no lovenox with CKD) as there is more evidence for this in COVID  DM2-Home regimen includes Januvia -ISS here. AC HS correctional   Hx of CAD -Continue  Aspirin  Best practice:  Diet: Diabetic diet Pain/Anxiety/Delirium protocol (if indicated): NA VAP protocol (if indicated): NA DVT prophylaxis: Apixaban GI prophylaxis: NA Glucose control: Correctional Mobility: As tolerated Code Status: Full per patient and daughter Jaynee Eagles Family Communication: Have spoken with daughter Jaynee Eagles. She would like everything done for now. Will make this so.  Disposition: ICU for now  Labs   CBC: No results for input(s): WBC, NEUTROABS, HGB, HCT, MCV, PLT in the last 168 hours.  Basic Metabolic Panel: No results for input(s): NA, K, CL, CO2, GLUCOSE, BUN, CREATININE, CALCIUM, MG, PHOS in the last 168 hours. GFR: CrCl cannot be calculated (Patient's most recent lab result is older than the maximum 21 days allowed.). No results for input(s): PROCALCITON, WBC, LATICACIDVEN in the last 168 hours.  Liver Function Tests: No results for input(s): AST, ALT, ALKPHOS, BILITOT, PROT, ALBUMIN in the last 168 hours. No results for input(s): LIPASE, AMYLASE in the last 168 hours. No results for input(s): AMMONIA in the last 168 hours.  ABG No results found for: PHART, PCO2ART, PO2ART, HCO3, TCO2, ACIDBASEDEF, O2SAT   Coagulation Profile: No results for input(s): INR, PROTIME in the last 168 hours.  Cardiac Enzymes: No results for input(s): CKTOTAL, CKMB, CKMBINDEX, TROPONINI in the last 168 hours.  HbA1C: Hgb A1c MFr Bld  Date/Time Value Ref Range Status  06/27/2014 11:57 AM 7.7 (H) 4.8 - 5.6 % Final    Comment:    (NOTE)         Pre-diabetes: 5.7 - 6.4         Diabetes: >6.4         Glycemic control for adults with diabetes: <7.0     CBG: No results for input(s): GLUCAP in the last 168 hours.  Review of Systems:   As per HPI, rest all negative.  Past Medical History  He,  has a past medical history of Anemia, Dementia ([place]), Diabetes mellitus without complication ([place]), HOH (hard of hearing), Hyperlipidemia, Hypertension, Seizures  ([place]), Shortness of breath dyspnea, Sick sinus syndrome ([place]), and Stroke ([place]).   Surgical History    Past Surgical History:  Procedure Laterality Date  . BACK SURGERY    . CARDIAC CATHETERIZATION    . CATARACT EXTRACTION    . CHOLECYSTECTOMY    . COLONOSCOPY    . CORONARY ARTERY BYPASS GRAFT    . ENDARTERECTOMY Right 06/28/2014   Procedure: RIGHT CAROTID ARTERY ENDARTERECTOMY ;  Surgeon: Mal Misty, MD;  Location: [facility];  Service: Vascular;  Laterality: Right;  . PATCH ANGIOPLASTY Right 06/28/2014   Procedure: WITH DACRON PATCH ANGIOPLASTY;  Surgeon: Mal Misty, MD;  Location: [facility];  Service: Vascular;  Laterality: Right;  . ROTATOR CUFF REPAIR Right      Social History   reports that he has never smoked. His smokeless tobacco use includes snuff. He reports that he does not drink alcohol or use drugs.   Family History   His family history includes  Cancer in his brother; Diabetes in his brother; Heart attack in his father; Heart disease in his brother and mother; Hyperlipidemia in his brother.   Allergies No Known Allergies   Home Medications  Prior to Admission medications   Medication Sig Start Date End Date Taking? Authorizing Provider  amLODipine (NORVASC) 10 MG tablet Take 10 mg by mouth daily.  06/03/15   [provider]  apixaban (ELIQUIS) 2.5 MG TABS tablet Take 1 tablet (2.5 mg total) by mouth 2 (two) times daily. 05/19/18   Revankar, Aundra Dubinajan R, MD  aspirin EC 81 MG tablet Take 1 tablet (81 mg total) by mouth daily. 05/22/18   Revankar, Aundra Dubinajan R, MD  DULoxetine (CYMBALTA) 30 MG capsule Take 30 mg by mouth daily.  01/05/17   [provider]  folic acid (FOLVITE) 1 MG tablet Take 1 mg by mouth daily.    [provider]  glipiZIDE (GLUCOTROL) 5 MG tablet Take 5 mg by mouth daily before breakfast.     [provider]  isosorbide mononitrate (IMDUR) 60 MG 24 hr tablet Take 60 mg by mouth 2 (two) times daily.  05/05/14   [provider]  JANUVIA 100 MG tablet Take 100 mg by mouth daily. 06/23/14   [provider]  levETIRAcetam (KEPPRA) 750 MG tablet Take 750 mg by mouth 2 (two) times daily.    [provider]  metoprolol succinate (TOPROL-XL) 100 MG 24 hr tablet Take 100 mg by mouth daily.  10/27/15   [provider]  mirtazapine (REMERON) 15 MG tablet TAKE 1 (ONE) TABLET BY MOUTH EVERYNIGHT AS NEEDED FOR SLEEP 11/20/16   [provider]  nitroGLYCERIN (NITROSTAT) 0.4 MG SL tablet Place 0.4 mg under the tongue every 5 (five) minutes as needed for chest pain.    [provider]  ondansetron (ZOFRAN-ODT) 4 MG disintegrating tablet DISSOLVE 1 (ONE) TABLET ON TONGUE EVERY FOUR-SIX HOURS AS NEEDED 01/05/17   [provider]  pravastatin (PRAVACHOL) 80 MG tablet Take 80 mg by mouth daily. 06/08/14   [provider]  quinapril (ACCUPRIL) 40 MG tablet Take 40 mg by mouth daily.    [provider]  rOPINIRole (REQUIP) 1 MG tablet TAKE 1-4 TABLET BY MOUTH EVERYNIGHT AT BEDTIME 12/25/16   [provider]  tamsulosin (FLOMAX) 0.4 MG CAPS capsule Take 1 capsule by mouth daily. 04/10/18   [provider]     Critical care time: 60 minutes

## 2018-08-20 NOTE — Progress Notes (Signed)
Patient admitted to 2M13, patient placed on Heated High Flow [place] at 55L 100% FIO2.  Patient BS are Diminished.  Patient sats are in the 39s.  Will continue to monitor patient.

## 2018-08-20 NOTE — Progress Notes (Signed)
eLink Physician-Brief Progress Note Patient Name: Gary Kirby DOB: April 09, 1933 MRN: [id number]   Date of Service  2018-08-21  HPI/Events of Note  85 y/o M HTN, DM, dyslipidemia, carotid artery stenosis s/p CEA, R 2016, CAD s/p CABG, SSS s/p pacemaker insertion, persistent afib on apixaban. Patient now presents with shortness of breath for 2 weeks and is COVID positive. High O2 requiring.  eICU Interventions   Patient to be placed on high flow O2  Inflammatory markers pending  Start Remdesevir, dexamethasone  Continue apixaban     Intervention Category Major Interventions: Respiratory failure - evaluation and management Evaluation Type: New Patient Evaluation  Judd Lien 21-Aug-2018, 9:34 PM

## 2018-08-21 DIAGNOSIS — J80 Acute respiratory distress syndrome: Secondary | ICD-10-CM

## 2018-08-21 DIAGNOSIS — N179 Acute kidney failure, unspecified: Secondary | ICD-10-CM

## 2018-08-21 DIAGNOSIS — J9601 Acute respiratory failure with hypoxia: Secondary | ICD-10-CM

## 2018-08-21 DIAGNOSIS — U071 COVID-19: Principal | ICD-10-CM

## 2018-08-21 DIAGNOSIS — J988 Other specified respiratory disorders: Secondary | ICD-10-CM

## 2018-08-21 LAB — HEPARIN LEVEL (UNFRACTIONATED)
Heparin Unfractionated: >2.2 IU/mL — ABNORMAL HIGH (ref 0.30–0.70)
Heparin Unfractionated: >2.2 IU/mL — ABNORMAL HIGH (ref 0.30–0.70)

## 2018-08-21 LAB — COMPREHENSIVE METABOLIC PANEL
ALT: 19 U/L (ref 0–44)
AST: 33 U/L (ref 15–41)
Albumin: 2.5 g/dL — ABNORMAL LOW (ref 3.5–5.0)
Alkaline Phosphatase: 64 U/L (ref 38–126)
Anion gap: 16 — ABNORMAL HIGH (ref 5–15)
BUN: 67 mg/dL — ABNORMAL HIGH (ref 8–23)
CO2: 16 mmol/L — ABNORMAL LOW (ref 22–32)
Calcium: 8.1 mg/dL — ABNORMAL LOW (ref 8.9–10.3)
Chloride: 103 mmol/L (ref 98–111)
Creatinine, Ser: 3.46 mg/dL — ABNORMAL HIGH (ref 0.61–1.24)
GFR calc Af Amer: 18 mL/min — ABNORMAL LOW (ref >60)
GFR calc non Af Amer: 15 mL/min — ABNORMAL LOW (ref >60)
Glucose, Bld: 307 mg/dL — ABNORMAL HIGH (ref 70–99)
Potassium: 4.5 mmol/L (ref 3.5–5.1)
Sodium: 135 mmol/L (ref 135–145)
Total Bilirubin: 0.5 mg/dL (ref 0.3–1.2)
Total Protein: 6 g/dL — ABNORMAL LOW (ref 6.5–8.1)

## 2018-08-21 LAB — CBC WITH DIFFERENTIAL/PLATELET
Abs Immature Granulocytes: 0.1 10*3/uL — ABNORMAL HIGH (ref 0.00–0.07)
Basophils Absolute: 0 10*3/uL (ref 0.0–0.1)
Basophils Relative: 0 %
Eosinophils Absolute: 0 10*3/uL (ref 0.0–0.5)
Eosinophils Relative: 0 %
HCT: 24.9 % — ABNORMAL LOW (ref 39.0–52.0)
Hemoglobin: 7.7 g/dL — ABNORMAL LOW (ref 13.0–17.0)
Immature Granulocytes: 1 %
Lymphocytes Relative: 5 %
Lymphs Abs: 0.7 10*3/uL (ref 0.7–4.0)
MCH: 30.2 pg (ref 26.0–34.0)
MCHC: 30.9 g/dL (ref 30.0–36.0)
MCV: 97.6 fL (ref 80.0–100.0)
Monocytes Absolute: 0.3 10*3/uL (ref 0.1–1.0)
Monocytes Relative: 2 %
Neutro Abs: 13.3 10*3/uL — ABNORMAL HIGH (ref 1.7–7.7)
Neutrophils Relative %: 92 %
Platelets: 203 10*3/uL (ref 150–400)
RBC: 2.55 MIL/uL — ABNORMAL LOW (ref 4.22–5.81)
RDW: 14.7 % (ref 11.5–15.5)
WBC: 14.4 10*3/uL — ABNORMAL HIGH (ref 4.0–10.5)
nRBC: 0 % (ref 0.0–0.2)

## 2018-08-21 LAB — PHOSPHORUS: Phosphorus: 5 mg/dL — ABNORMAL HIGH (ref 2.5–4.6)

## 2018-08-21 LAB — LACTIC ACID, PLASMA: Lactic Acid, Venous: 1.8 mmol/L (ref 0.5–1.9)

## 2018-08-21 LAB — APTT
aPTT: 41 seconds — ABNORMAL HIGH (ref 24–36)
aPTT: 96 seconds — ABNORMAL HIGH (ref 24–36)

## 2018-08-21 LAB — D-DIMER, QUANTITATIVE: D-Dimer, Quant: 4.07 ug/mL-FEU — ABNORMAL HIGH (ref 0.00–0.50)

## 2018-08-21 LAB — PROCALCITONIN: Procalcitonin: 1.08 ng/mL

## 2018-08-21 LAB — GLUCOSE, CAPILLARY
Glucose-Capillary: 252 mg/dL — ABNORMAL HIGH (ref 70–99)
Glucose-Capillary: 285 mg/dL — ABNORMAL HIGH (ref 70–99)
Glucose-Capillary: 248 mg/dL — ABNORMAL HIGH (ref 70–99)
Glucose-Capillary: 210 mg/dL — ABNORMAL HIGH (ref 70–99)

## 2018-08-21 LAB — MRSA PCR SCREENING: MRSA by PCR: POSITIVE — AB

## 2018-08-21 LAB — FIBRINOGEN: Fibrinogen: 759 mg/dL — ABNORMAL HIGH (ref 210–475)

## 2018-08-21 LAB — MAGNESIUM: Magnesium: 1.9 mg/dL (ref 1.7–2.4)

## 2018-08-21 MED ORDER — CHLORHEXIDINE GLUCONATE CLOTH 2 % EX PADS
6.0000 | MEDICATED_PAD | Freq: Every day | CUTANEOUS | Status: DC
  Administered 2018-08-21: 6 via TOPICAL

## 2018-08-21 MED ORDER — HEPARIN (PORCINE) 25000 UT/250ML-% IV SOLN
1150.0000 [IU]/h | INTRAVENOUS | Status: DC
  Administered 2018-08-21: 1250 [IU]/h via INTRAVENOUS
  Administered 2018-08-22: 1150 [IU]/h via INTRAVENOUS
  Filled 2018-08-21 (×2): qty 250

## 2018-08-21 MED ORDER — NOREPINEPHRINE 4 MG/250ML-% IV SOLN
INTRAVENOUS | Status: AC
  Administered 2018-08-21: 07:00:00 2 ug/min via INTRAVENOUS
  Filled 2018-08-21: qty 250

## 2018-08-21 MED ORDER — ALBUMIN HUMAN 25 % IV SOLN
12.5000 g | Freq: Once | INTRAVENOUS | Status: AC
  Administered 2018-08-21: 12.5 g via INTRAVENOUS
  Filled 2018-08-21: qty 50

## 2018-08-21 MED ORDER — NOREPINEPHRINE 4 MG/250ML-% IV SOLN
0.0000 ug/min | INTRAVENOUS | Status: DC
  Administered 2018-08-21: 11:00:00 4 ug/min via INTRAVENOUS
  Administered 2018-08-21 – 2018-08-22 (×2): 2 ug/min via INTRAVENOUS

## 2018-08-21 MED ORDER — SODIUM CHLORIDE 0.9 % IV SOLN
INTRAVENOUS | Status: AC
  Administered 2018-08-21: 18:00:00 via INTRAVENOUS

## 2018-08-21 MED ORDER — ORAL CARE MOUTH RINSE
15.0000 mL | Freq: Two times a day (BID) | OROMUCOSAL | Status: DC
  Administered 2018-08-21 (×2): 15 mL via OROMUCOSAL

## 2018-08-21 NOTE — Progress Notes (Signed)
Patient started to desat again, HFNC had come out of patient's nose.  Went in and adjusted but patient still would not come up well. Adjusted FIO2 to 80%, will reassess in one hour to see if patient recovers and it becomes possible to start weaning down FIO2 again.

## 2018-08-21 NOTE — Progress Notes (Signed)
Patient sats have started to fluctuate since patient has been on his side and is no longer prone.  Have taken FIO2 up to 90%.  Sats are now back in 90s.  RN is aware, as well as Elzie Rings, Therapist, sports in the box.  Will continue to monitor.

## 2018-08-21 NOTE — Progress Notes (Addendum)
Patient sats were fluctuating.  Spoke with Dr. Orpah Melter; we decided to prone patient to try and get sats up.  Proned patient and sats came up to 90 to 100%.  Will continue to monitor patient and attempt to come down on FIO2 as sats improve.

## 2018-08-21 NOTE — Progress Notes (Signed)
Spoke with patient's daughter over the phone.  She is COVID positive apparently and is not coming to the hospital.  We had an extensive conversation regarding plan of care.  After discussion decision was made to make patient a full DNR.  No intubation, CPR, cardioversion or pressors.  If patient is to decompensate then consider comfort.  Rush Farmer, M.D. [facility] Pulmonary/Critical Care Medicine. Pager: [phone]. After hours pager: [phone].

## 2018-08-21 NOTE — Progress Notes (Signed)
ANTICOAGULATION CONSULT NOTE - Initial Consult  Pharmacy Consult for Transition Apixaban to IV Heparin Indication: atrial fibrillation  No Known Allergies  Patient Measurements: Height: 6\' 1"  (185.4 cm) Weight: 182 lb 1.6 oz (82.6 kg) IBW/kg (Calculated) : 79.9 Heparin Dosing Weight: 82.6 kg  Vital Signs: Temp: 98 F (36.7 C) (07/13 0100) Temp Source: Oral (07/13 0100) BP: 80/53 (07/13 0500) Pulse Rate: 63 (07/13 0500)  Labs: Recent Labs    09/02/2018 2234 08/21/18 0213  HGB 8.2* 7.7*  HCT 26.8* 24.9*  PLT 216 203  CREATININE 3.67* 3.46*  TROPONINIHS 11  --     Estimated Creatinine Clearance: 18.3 mL/min (A) (by C-G formula based on SCr of 3.46 mg/dL (H)).   Medical History: Past Medical History:  Diagnosis Date  . Anemia   . Dementia ([place])   . Diabetes mellitus without complication (HCC)    Type 2  . HOH (hard of hearing)   . Hyperlipidemia   . Hypertension   . Seizures ([place])   . Shortness of breath dyspnea    with ambulation  . Sick sinus syndrome ([place])   . Stroke [facility])     Assessment: 85 year old male who is COVID + with atrial fibrillation on Apixaban prior to admission. MD plans to transition to IV Heparin per pharmacy protocol. Last Apixaban dose 2.5 given at 2300 PM. Patient is acute renal failure with SCr 3.46 and estimated CrCl ~ 18 mL/min. Hgb 8.2 >>7.7. No bleeding reported.  Platelets are stable at 203. Fibrinogen 759, D-dimer 4.07.  *Heparin level will be impacted (elevated) by recent Apixaban) - Will use aPTT to monitor until aPTT and Heparin levels are correlating.   Goal of Therapy:  Heparin level 0.3-0.7 units/ml aPTT 66-102 seconds Monitor platelets by anticoagulation protocol: Yes   Plan:  Discontinue Apixaban.  Baseline HL and aPTT now.  Start Heparin drip at 1100AM at rate of 1250 units/hr. APTT and Heparin level in 8 hours after infusion started. Daily aPTT and Heparin level until correlating. Daily CBC while on therapy.    Sloan Leiter, PharmD, BCPS, BCCCP Clinical Pharmacist Please refer to [facility] for [place] numbers 08/21/2018,8:49 AM

## 2018-08-21 NOTE — Progress Notes (Signed)
Inpatient Diabetes Program Recommendations  AACE/ADA: New Consensus Statement on Inpatient Glycemic Control (2015)  Target Ranges:  Prepandial:   less than 140 mg/dL      Peak postprandial:   less than 180 mg/dL (1-2 hours)      Critically ill patients:  140 - 180 mg/dL   Lab Results  Component Value Date   GLUCAP 252 (H) 08/21/2018   HGBA1C 7.5 (H) 08/26/2018    Review of Glycemic Control Results for Gary Kirby, Gary Kirby (MRN [id number]) as of 08/21/2018 11:28  Ref. Range 09/08/2018 23:22 08/21/2018 09:01  Glucose-Capillary Latest Ref Range: 70 - 99 mg/dL 282 (H) 252 (H)   Diabetes history: Type 2DM Outpatient Diabetes medications: Glipizide 2.5 mg BID, Januvia 100 mg QD Current orders for Inpatient glycemic control: Novolog 0-15 units TID, Novolog 0-5 units QHS Decadron 6 mg QD  Inpatient Diabetes Program Recommendations:    In the setting of steroids, Consider adding Levemir 12 units QD.   Thanks, Bronson Curb, MSN, RNC-OB Diabetes Coordinator [phone] (8a-5p)

## 2018-08-21 NOTE — Progress Notes (Signed)
[place] for Transition Apixaban to IV Heparin Indication: atrial fibrillation  No Known Allergies  Patient Measurements: Height: 6\' 1"  (185.4 cm) Weight: 182 lb 1.6 oz (82.6 kg) IBW/kg (Calculated) : 79.9 Heparin Dosing Weight: 82.6 kg  Vital Signs: Temp: 97.9 F (36.6 C) (07/13 1600) Temp Source: Axillary (07/13 1600) BP: 80/19 (07/13 2300) Pulse Rate: 66 (07/13 2300)  Labs: Recent Labs    08-28-2018 2234 08/21/18 0213 08/21/18 0929 08/21/18 1904 08/21/18 2237  HGB 8.2* 7.7*  --   --   --   HCT 26.8* 24.9*  --   --   --   PLT 216 203  --   --   --   APTT  --   --  41*  --  96*  HEPARINUNFRC  --   --  >2.20* >2.20*  --   CREATININE 3.67* 3.46*  --   --   --   TROPONINIHS 11  --   --   --   --     Estimated Creatinine Clearance: 18.3 mL/min (A) (by C-G formula based on SCr of 3.46 mg/dL (H)).   Assessment: 85 year old male who is COVID + with atrial fibrillation on Apixaban prior to admission. MD plans to transition to IV Heparin per pharmacy protocol. Last Apixaban dose 2.5 given at 2300 PM. Patient is acute renal failure with SCr 3.46 and estimated CrCl ~ 18 mL/min. Hgb 8.2 >>7.7. No bleeding reported.  Platelets are stable at 203. Fibrinogen 759, D-dimer 4.07.  APTT 96 sec  Goal of Therapy:  Heparin level 0.3-0.7 units/ml aPTT 66-102 seconds Monitor platelets by anticoagulation protocol: Yes   Plan:  Continue Heparin drip at 1250 units/hr. Daily aPTT and Heparin level until correlating. Daily CBC while on therapy.   Thanks for allowing pharmacy to be a part of this patient's care.  Excell Seltzer, PharmD Clinical Pharmacist

## 2018-08-21 NOTE — Progress Notes (Signed)
NAME:  LENORRIS KARGER, MRN:  [id number], DOB:  [birth year], LOS: 1 ADMISSION DATE:  08/17/2018, CONSULTATION DATE:  08/21/2018  CHIEF COMPLAINT:  SOB  Brief History   85 yo transferred to ICU for hypoxic respiratory failure  History of present illness   This is an 85 yo male with history of PAF, pacemaker, comlpete heart block, CABG,  CKD3, CVA, seizures, and DM2 who presents for transfer for hypoxic respiratory failure 2/2 ARDS. H and P is limited by patients mild confusion. And unable to contact daughter with whom he lives at this time. Patient notes he has felt SOB for appx 2 months. As of the last few days he has been having cough and intermittent fevers. He notes decreased PO intake. No diarrhea or vomiting. Brought in to hospital because he could not walk to the bathroom without assistance do to severe SOB. This is new for him. No swelling noted. No increased sputum production. He is not a smoker. He has never been diagnosed with COPD. Has significant heart history but unaware of details. Not sure of what medications he is taking either.   Seen at [facility]. Noted to have had 2 weeks of feeling SOB. Noted to have UTI at PCP when seen  2 weeks ago. Noted to continue to have SOB and brought into ED. EMS noted patient to be sating at 55% on RA. Not on oxygen at home. In ED noted to be 60% on 15 liters non rebreather. Covid drawn and positive. BMP notable for creatinine of 3.30. CXR noted to have bilateral airspace opacities consistent with pneumonia. DDIMER 1027. PT INR 1.2ABG with PAO2 of 38.LFT's normal and CRP of 382. Trop less than 0.01. LA 3.1. UA with negative LE and nitrites. SARS COV2 -Positive  Past Medical History  PAF PM Heart block CABG DM2 ??? COPD. Patient denies this and smoking history.   Significant Hospital Events   Admitted to ICU on 08/30/2018.   Consults:  NA  Procedures:  NA  Significant Diagnostic Tests:  SARS COV2-positive  Micro Data:  Positive PCR of  COVID BCX collected on 7/12  Antimicrobials:  CTX Azithromycin  Interim history/subjective:  Remains profoundly hypoxemic overnight but appears comfortable  Objective   Blood pressure (!) 80/53, pulse 63, temperature 98 F (36.7 C), temperature source Oral, resp. rate 17, SpO2 93 %.    FiO2 (%):  [70 %-90 %] 90 %   Intake/Output Summary (Last 24 hours) at 08/21/2018 0759 Last data filed at 08/21/2018 0500 Gross per 24 hour  Intake 664.22 ml  Output -  Net 664.22 ml   There were no vitals filed for this visit.  Examination: General: Acutely ill appearing male, NAD HENT: Okay/AT, PERRL, EOM-I and MMM Lungs: Diffuse crackles, on heated high flow at 60L and 100% saturating 93% with minimal WOB Cardiovascular: RRR, Nl S1/S2 and -M/R/G Abdomen: Soft, NT, ND and +BS Extremities: -edema and -tenderness Neuro: Alert and interactive, moving all ext to command GU: No abnormalities noted  I reviewed CXR myself, diffuse infiltrate noted  Discussed with bedside RN and RT  Resolved Hospital Problem list   NA  Assessment & Plan:  This is a 85 yo male with history as noted above who present for hypoxic respiratory failure. Overall seem to be doing well.  Hypoxic respiratory failure 2/2 COVID ARDS - CXR in AM - Hold off further ABGs - If able to place on 100% NRB and well tolerated for [facility] to [facility] then will transfer,  if unable to get off HFNC then [facility] - Remdesivir since Cr clearance is <30 - Decardon 6 mg daily for next 10 days - Risk stratification labs with D-dimer elevated, IL-6 pending - Could attain CTA of chest however do not think this would change management, start full dose heparin drip  - KVO IVF - Will hold of lasix for now given renal function - Patient instructed on self proning - CAP Coverage - Tolerating sats in low 80's as patient with minimal WOB at this time. May transition temporarily to CPAP to help recruit  - Will hold  off intubation as long as able  A-fib- - D/C Apixaban - Holding metoprolol as pressure marginal - D Dimer 4.07, will switch to heparin drip  DM2-Home regimen includes Januvia - ISS here. AC HS correctional  Hx of CAD - Continue Aspirin  ARF: - Hold off renal consult for now - D/C lasix - BMET in AM - Replace electrolytes as indicated  GOC: - Unable to get a hold of family, need to discuss plan of care prior to further escalation of ICU needs  Best practice:  Diet: Diabetic diet Pain/Anxiety/Delirium protocol (if indicated): NA VAP protocol (if indicated): NA DVT prophylaxis: Apixaban GI prophylaxis: NA Glucose control: Correctional Mobility: As tolerated Code Status: Full per patient and daughter Beckey RutterKerry Staley Family Communication: Have spoken with daughter Beckey RutterKerry Staley. She would like everything done for now. Will make this so.  Disposition: ICU for now  Labs   CBC: Recent Labs  Lab 08/17/2018 2234 08/21/18 0213  WBC 15.9* 14.4*  NEUTROABS 15.1* 13.3*  HGB 8.2* 7.7*  HCT 26.8* 24.9*  MCV 100.8* 97.6  PLT 216 203    Basic Metabolic Panel: Recent Labs  Lab 08/25/2018 2234 08/21/18 0213  NA 135 135  K 4.6 4.5  CL 103 103  CO2 16* 16*  GLUCOSE 308* 307*  BUN 66* 67*  CREATININE 3.67* 3.46*  CALCIUM 8.3* 8.1*  MG  --  1.9  PHOS  --  5.0*   GFR: CrCl cannot be calculated (Unknown ideal weight.). Recent Labs  Lab 09/04/2018 2234 08/21/18 0213  WBC 15.9* 14.4*  LATICACIDVEN 1.8 1.8    Liver Function Tests: Recent Labs  Lab 08/16/2018 2234 08/21/18 0213  AST 35 33  ALT 20 19  ALKPHOS 66 64  BILITOT 0.8 0.5  PROT 6.6 6.0*  ALBUMIN 2.8* 2.5*   No results for input(s): LIPASE, AMYLASE in the last 168 hours. No results for input(s): AMMONIA in the last 168 hours.  ABG    Component Value Date/Time   PHART 7.367 08/26/2018 2250   PCO2ART 33.1 08/23/2018 2250   PO2ART 39.4 (LL) 08/11/2018 2250   HCO3 18.5 (L) 08/28/2018 2250   ACIDBASEDEF 5.8  (H) 08/25/2018 2250   O2SAT 64.4 08/13/2018 2250     Coagulation Profile: No results for input(s): INR, PROTIME in the last 168 hours.  Cardiac Enzymes: No results for input(s): CKTOTAL, CKMB, CKMBINDEX, TROPONINI in the last 168 hours.  HbA1C: Hgb A1c MFr Bld  Date/Time Value Ref Range Status  08/12/2018 10:34 PM 7.5 (H) 4.8 - 5.6 % Final    Comment:    (NOTE) Pre diabetes:          5.7%-6.4% Diabetes:              >6.4% Glycemic control for   <7.0% adults with diabetes   06/27/2014 11:57 AM 7.7 (H) 4.8 - 5.6 %  Final    Comment:    (NOTE)         Pre-diabetes: 5.7 - 6.4         Diabetes: >6.4         Glycemic control for adults with diabetes: <7.0     CBG: Recent Labs  Lab 08/19/2018 2322  GLUCAP 282*   The patient is critically ill with multiple organ systems failure and requires high complexity decision making for assessment and support, frequent evaluation and titration of therapies, application of advanced monitoring technologies and extensive interpretation of multiple databases.   Critical Care Time devoted to patient care services described in this note is  40  Minutes. This time reflects time of care of this signee Dr Koren BoundWesam . This critical care time does not reflect procedure time, or teaching time or supervisory time of PA/NP/Med student/Med Resident etc but could involve care discussion time.  Alyson ReedyWesam G. , M.D. [facility] Pulmonary/Critical Care Medicine. Pager: [phone]. After hours pager: [phone].

## 2018-08-21 NOTE — Progress Notes (Signed)
eLink Physician-Brief Progress Note Patient Name: KOOPER GODSHALL DOB: 06-01-1933 MRN: [id number]   Date of Service  08/21/2018  HPI/Events of Note  Notified of hypotension despite fluid bolus.  eICU Interventions  Will give a trial of albumin. If persistently hypotensive may need pressors, +/- central line.     Intervention Category Major Interventions: Hypotension - evaluation and management  Judd Lien 08/21/2018, 3:24 AM

## 2018-08-22 LAB — CBC WITH DIFFERENTIAL/PLATELET
Abs Immature Granulocytes: 0.27 10*3/uL — ABNORMAL HIGH (ref 0.00–0.07)
Basophils Absolute: 0 10*3/uL (ref 0.0–0.1)
Basophils Relative: 0 %
Eosinophils Absolute: 0 10*3/uL (ref 0.0–0.5)
Eosinophils Relative: 0 %
HCT: 25.8 % — ABNORMAL LOW (ref 39.0–52.0)
Hemoglobin: 7.9 g/dL — ABNORMAL LOW (ref 13.0–17.0)
Immature Granulocytes: 2 %
Lymphocytes Relative: 3 %
Lymphs Abs: 0.4 10*3/uL — ABNORMAL LOW (ref 0.7–4.0)
MCH: 30.2 pg (ref 26.0–34.0)
MCHC: 30.6 g/dL (ref 30.0–36.0)
MCV: 98.5 fL (ref 80.0–100.0)
Monocytes Absolute: 0.3 10*3/uL (ref 0.1–1.0)
Monocytes Relative: 2 %
Neutro Abs: 14.8 10*3/uL — ABNORMAL HIGH (ref 1.7–7.7)
Neutrophils Relative %: 93 %
Platelets: 210 10*3/uL (ref 150–400)
RBC: 2.62 MIL/uL — ABNORMAL LOW (ref 4.22–5.81)
RDW: 15 % (ref 11.5–15.5)
WBC: 15.9 10*3/uL — ABNORMAL HIGH (ref 4.0–10.5)
nRBC: 0.3 % — ABNORMAL HIGH (ref 0.0–0.2)

## 2018-08-22 LAB — COMPREHENSIVE METABOLIC PANEL
ALT: 37 U/L (ref 0–44)
AST: 63 U/L — ABNORMAL HIGH (ref 15–41)
Albumin: 2.5 g/dL — ABNORMAL LOW (ref 3.5–5.0)
Alkaline Phosphatase: 68 U/L (ref 38–126)
Anion gap: 14 (ref 5–15)
BUN: 84 mg/dL — ABNORMAL HIGH (ref 8–23)
CO2: 17 mmol/L — ABNORMAL LOW (ref 22–32)
Calcium: 8 mg/dL — ABNORMAL LOW (ref 8.9–10.3)
Chloride: 106 mmol/L (ref 98–111)
Creatinine, Ser: 3.79 mg/dL — ABNORMAL HIGH (ref 0.61–1.24)
GFR calc Af Amer: 16 mL/min — ABNORMAL LOW (ref >60)
GFR calc non Af Amer: 14 mL/min — ABNORMAL LOW (ref >60)
Glucose, Bld: 314 mg/dL — ABNORMAL HIGH (ref 70–99)
Potassium: 5.2 mmol/L — ABNORMAL HIGH (ref 3.5–5.1)
Sodium: 137 mmol/L (ref 135–145)
Total Bilirubin: 0.4 mg/dL (ref 0.3–1.2)
Total Protein: 5.9 g/dL — ABNORMAL LOW (ref 6.5–8.1)

## 2018-08-22 LAB — MAGNESIUM: Magnesium: 2.1 mg/dL (ref 1.7–2.4)

## 2018-08-22 LAB — PHOSPHORUS: Phosphorus: 6.2 mg/dL — ABNORMAL HIGH (ref 2.5–4.6)

## 2018-08-22 LAB — APTT: aPTT: 122 seconds — ABNORMAL HIGH (ref 24–36)

## 2018-08-22 LAB — HEPARIN LEVEL (UNFRACTIONATED)
Heparin Unfractionated: >2.2 IU/mL — ABNORMAL HIGH (ref 0.30–0.70)
Heparin Unfractionated: >2.2 IU/mL — ABNORMAL HIGH (ref 0.30–0.70)

## 2018-08-22 LAB — INTERLEUKIN-6, PLASMA: Interleukin-6, Plasma: 79.8 pg/mL — ABNORMAL HIGH (ref 0.0–12.2)

## 2018-08-22 LAB — PROCALCITONIN: Procalcitonin: 0.91 ng/mL

## 2018-08-22 MED ORDER — MUPIROCIN 2 % EX OINT: 1.0000 "application " | TOPICAL_OINTMENT | Freq: Two times a day (BID) | CUTANEOUS | Status: DC

## 2018-08-22 MED ORDER — GLYCOPYRROLATE 0.2 MG/ML IJ SOLN: 0.2000 mg | INTRAMUSCULAR | Status: DC | PRN

## 2018-08-22 MED ORDER — POLYVINYL ALCOHOL 1.4 % OP SOLN
1.0000 [drp] | Freq: Four times a day (QID) | OPHTHALMIC | Status: DC | PRN
  Filled 2018-08-22: qty 15

## 2018-08-22 MED ORDER — GLYCOPYRROLATE 1 MG PO TABS: 1.0000 mg | ORAL_TABLET | ORAL | Status: DC | PRN

## 2018-08-22 MED ORDER — MORPHINE SULFATE (PF) 2 MG/ML IV SOLN
2.0000 mg | INTRAVENOUS | Status: DC | PRN
  Administered 2018-08-22: 4 mg via INTRAVENOUS
  Filled 2018-08-22: qty 2

## 2018-08-25 LAB — CULTURE, BLOOD (ROUTINE X 2)
Culture: NO GROWTH
Culture: NO GROWTH
Special Requests: ADEQUATE

## 2018-08-25 NOTE — Telephone Encounter (Signed)
Patient passed away 7/14 @ [facility]

## 2018-09-09 NOTE — Progress Notes (Signed)
Patient desatured to 44%, attempted to prone patient with RN. HFNC increased to 70L/100%, patient sats returned to mid 70's. Will continue to monitor patient

## 2018-09-09 NOTE — Discharge Summary (Signed)
Physician Death Summary  Patient ID: Gary Kirby MRN: [id number] DOB/AGE: 1933-05-16 85 y.o.  Admit date: 2018-09-02 Discharge date: 08/17/2018  Admission Diagnoses: Acute respiratory failure COVID 19 PNA  Discharge Diagnoses:  Acute respiratory failure ARDS COVID-19  Discharged Condition: Deceased  Hospital Course:  This is an 85 yo male with history of PAF, pacemaker, comlpete heart block, CABG,  CKD3, CVA, seizures, and DM2 who was seen at [facility]. Noted to have had 2 weeks of feeling SOB. Noted to have UTI at PCP when seen  2 weeks ago. Noted to continue to have SOB and brought into ED. EMS noted patient to be sating at 55% on RA. Not on oxygen at home. In ED noted to be 60% on 15 liters non rebreather. Covid drawn and positive.  Transferred to [facility].  His clinical status continued to deteriorate.  After discussion with daughter the CODE STATUS changed to DNR He remained hypoxic on high flow nasal cannula.  On the morning of 7/14 his respiratory status worsened with agonal breathing and bradycardia.  Daughter was updated and patient was transitioned to comfort measures.  He passed away at 8:53 AM  Signed: Zooey Schreurs 08/30/2018, 11:43 AM

## 2018-09-09 NOTE — Progress Notes (Signed)
Assisted tele visit to patient with daughter.  Willye Javier R, RN  

## 2018-09-09 NOTE — Progress Notes (Signed)
After getting report from the night shift nurse, I noticed that the patient had pulled his IV out and was leaning against the bed. I put on proper PPE to go in and get him repositioned and to stop his IV from bleeding. As I walked into the room I found the patient unresponsive and not breathing. I put his high flow back on and the NRB and he began to agonal breathe. Patient time of death was [phone]

## 2018-09-09 NOTE — Progress Notes (Signed)
Early for Transition Apixaban to IV Heparin Indication: atrial fibrillation  No Known Allergies  Patient Measurements: Height: 6\' 1"  (185.4 cm) Weight: 182 lb 1.6 oz (82.6 kg) IBW/kg (Calculated) : 79.9 Heparin Dosing Weight: 82.6 kg  Vital Signs: Temp: 95.4 F (35.2 C) (07/14 0400) Temp Source: Rectal (07/14 0400) BP: 101/75 (07/14 0500) Pulse Rate: 65 (07/14 0500)  Labs: Recent Labs    09/02/2018 2234 08/21/18 0213  08/21/18 0929 08/21/18 1904 08/21/18 2237 26-Aug-2018 0316  HGB 8.2* 7.7*  --   --   --   --  7.9*  HCT 26.8* 24.9*  --   --   --   --  25.8*  PLT 216 203  --   --   --   --  210  APTT  --   --   --  41*  --  96* 122*  HEPARINUNFRC  --   --    < > >2.20* >2.20* >2.20* >2.20*  CREATININE 3.67* 3.46*  --   --   --   --  3.79*  TROPONINIHS 11  --   --   --   --   --   --    < > = values in this interval not displayed.    Estimated Creatinine Clearance: 16.7 mL/min (A) (by C-G formula based on SCr of 3.79 mg/dL (H)).   Assessment: 85 year old male who is COVID + with atrial fibrillation on Apixaban prior to admission. MD plans to transition to IV Heparin per pharmacy protocol. Last Apixaban dose 2.5 given at 2300 PM. Patient is acute renal failure with SCr 3.46 and estimated CrCl ~ 18 mL/min. Hgb 8.2 >>7.7. No bleeding reported.  Platelets are stable at 203. Fibrinogen 759, D-dimer 4.07.  APTT 96 sec  Goal of Therapy:  Heparin level 0.3-0.7 units/ml aPTT 66-102 seconds Monitor platelets by anticoagulation protocol: Yes   Plan:  Continue Heparin drip at 1250 units/hr. Daily aPTT and Heparin level until correlating. Daily CBC while on therapy.   Thanks for allowing pharmacy to be a part of this patient's care.  Excell Seltzer, PharmD Clinical Pharmacist  Aptt this am 122sec.  Decrease heparin drip to 1150 units/hr.  Check aPTT in 6-8 hours

## 2018-09-09 NOTE — Progress Notes (Signed)
NAME:  Gary Kirby, MRN:  [id number], DOB:  1933/11/26, LOS: 2 ADMISSION DATE:  Oct 27, 2018, CONSULTATION DATE:  2018/10/15  CHIEF COMPLAINT:  SOB  Brief History   85 yo transferred to ICU for hypoxic respiratory failure  History of present illness   This is an 85 yo male with history of PAF, pacemaker, comlpete heart block, CABG,  CKD3, CVA, seizures, and DM2 who presents for transfer for hypoxic respiratory failure 2/2 ARDS. H and P is limited by patients mild confusion. And unable to contact daughter with whom he lives at this time. Patient notes he has felt SOB for appx 2 months. As of the last few days he has been having cough and intermittent fevers. He notes decreased PO intake. No diarrhea or vomiting. Brought in to hospital because he could not walk to the bathroom without assistance do to severe SOB. This is new for him. No swelling noted. No increased sputum production. He is not a smoker. He has never been diagnosed with COPD. Has significant heart history but unaware of details. Not sure of what medications he is taking either.   [facility] Noted to have had 2 weeks of feeling SOB. Noted to have UTI at PCP when seen  2 weeks ago. Noted to continue to have SOB and brought into ED. EMS noted patient to be sating at 55% on RA. Not on oxygen at home. In ED noted to be 60% on 15 liters non rebreather. Covid drawn and positive. BMP notable for creatinine of 3.30. CXR noted to have bilateral airspace opacities consistent with pneumonia. DDIMER [id number]. PT INR 1.2ABG with PAO2 of 38.LFT's normal and CRP of 382. Trop less than 0.01. LA 3.1. UA with negative LE and nitrites. SARS COV2 -Positive  Past Medical History  PAF PM Heart block CABG DM2 ??? COPD. Patient denies this and smoking history.   Significant Hospital Events   Admitted to ICU on 2018/10/15.   Consults:  NA  Procedures:  NA  Significant Diagnostic Tests:  SARS COV2-positive  Micro Data:  Positive PCR of  COVID BCX collected on 7/12 >> ngtd  7/13 MRSA PCR >> positive  Antimicrobials:  7/12 CTX 7/12 Azithromycin 7/12 remdesivir   Interim history/subjective:  Sats in the 50's most of the night despite high flow at 70/ 100%  Shortly after shift change this morning, patient became unresponsive followed by agonal breathing and worsening hypotension  Objective   Blood pressure (!) 91/44, pulse 68, temperature (!) 95.4 F (35.2 C), temperature source Rectal, resp. rate (!) 23, height 6\' 1"  (1.854 m), weight 82.6 kg, SpO2 (!) 68 %.   - BP now 67/41, hr wide complex 61- paced, unreadable sats    FiO2 (%):  [90 %-100 %] 100 %   Intake/Output Summary (Last 24 hours) at 08/12/2018 0759 Last data filed at 09/04/2018 0600 Gross per 24 hour  Intake 1692.81 ml  Output 825 ml  Net 867.81 ml   Filed Weights   08/21/18 0800  Weight: 82.6 kg    Examination: General:  Actively dying male, does not seem to be in distre Neuro: unresponsive CV: rr- paced PULM: agonal  Extremities: cool / mottled   Resolved Hospital Problem list   NA  Assessment & Plan:  This is a 85 yo male with history as noted above who present for hypoxic respiratory failure. Worsening oxygenation despite proning and HFNC 7/13.  Code status changed to DNR/ DNI on 7/13.    Hypoxic respiratory  failure 2/2 COVID ARDS A-fib DM2-Home regimen includes Januvia Hx of CAD ARF - patient is actively dying, death is imminent.  Daughter Diane called with updated (she currently has COVID) with ongoing support given.  Set up with Elink for her to video in now.  - morphine prn, will transfer to full comfort care at this time    Best practice:  Diet: npo  Pain/Anxiety/Delirium protocol (if indicated): NA VAP protocol (if indicated): NA DVT prophylaxis: d/c Apixaban GI prophylaxis: NA Glucose control: n/a Mobility: As tolerated Code Status: DNR/ DNI  Family Communication: as above  Disposition: ICU  Labs   CBC: Recent  Labs  Lab 08/11/2018 2234 08/21/18 0213 2018-09-10 0316  WBC 15.9* 14.4* 15.9*  NEUTROABS 15.1* 13.3* 14.8*  HGB 8.2* 7.7* 7.9*  HCT 26.8* 24.9* 25.8*  MCV 100.8* 97.6 98.5  PLT 216 203 093    Basic Metabolic Panel: Recent Labs  Lab 08/19/2018 2234 08/21/18 0213 10-Sep-2018 0316  NA 135 135 137  K 4.6 4.5 5.2*  CL 103 103 106  CO2 16* 16* 17*  GLUCOSE 308* 307* 314*  BUN 66* 67* 84*  CREATININE 3.67* 3.46* 3.79*  CALCIUM 8.3* 8.1* 8.0*  MG  --  1.9 2.1  PHOS  --  5.0* 6.2*   GFR: Estimated Creatinine Clearance: 16.7 mL/min (A) (by C-G formula based on SCr of 3.79 mg/dL (H)). Recent Labs  Lab 08/13/2018 2234 08/21/18 0213 08/21/18 0929 September 10, 2018 0316  PROCALCITON  --   --  1.08 0.91  WBC 15.9* 14.4*  --  15.9*  LATICACIDVEN 1.8 1.8  --   --     Liver Function Tests: Recent Labs  Lab 09/05/2018 2234 08/21/18 0213 09/10/18 0316  AST 35 33 63*  ALT 20 19 37  ALKPHOS 66 64 68  BILITOT 0.8 0.5 0.4  PROT 6.6 6.0* 5.9*  ALBUMIN 2.8* 2.5* 2.5*   No results for input(s): LIPASE, AMYLASE in the last 168 hours. No results for input(s): AMMONIA in the last 168 hours.  ABG    Component Value Date/Time   PHART 7.367 08/28/2018 2250   PCO2ART 33.1 08/13/2018 2250   PO2ART 39.4 (LL) 08/09/2018 2250   HCO3 18.5 (L) 08/19/2018 2250   ACIDBASEDEF 5.8 (H) 08/11/2018 2250   O2SAT 64.4 09/03/2018 2250     Coagulation Profile: No results for input(s): INR, PROTIME in the last 168 hours.  Cardiac Enzymes: No results for input(s): CKTOTAL, CKMB, CKMBINDEX, TROPONINI in the last 168 hours.  HbA1C: Hgb A1c MFr Bld  Date/Time Value Ref Range Status  08/19/2018 10:34 PM 7.5 (H) 4.8 - 5.6 % Final    Comment:    (NOTE) Pre diabetes:          5.7%-6.4% Diabetes:              >6.4% Glycemic control for   <7.0% adults with diabetes   06/27/2014 11:57 AM 7.7 (H) 4.8 - 5.6 % Final    Comment:    (NOTE)         Pre-diabetes: 5.7 - 6.4         Diabetes: >6.4         Glycemic  control for adults with diabetes: <7.0     CBG: Recent Labs  Lab 09/06/2018 2322 08/21/18 0901 08/21/18 1153 08/21/18 1626 08/21/18 2116  GLUCAP 282* 252* 285* 248* 210*   CCT 30 mins  Kennieth Rad, MSN, AGACNP-BC [facility] Pgr: [phone] or if no answer [phone]  29-Dec-2018, 7:59 AM

## 2018-09-09 DEATH — deceased

## 2018-09-14 ENCOUNTER — Telehealth: Payer: Medicare Other | Admitting: Cardiology

## 2018-10-07 ENCOUNTER — Other Ambulatory Visit: Payer: Self-pay | Admitting: Cardiology

## 2019-11-26 IMAGING — NM NM MYOCAR PERF WALL MOTION
6 series · 36 of 36 positions shown · non-contrast
Comparison: none

[Series 1: wbr rest · 6.40mm/px · 6 of 64 frames shown]
[frame 6/64]
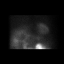
[frame 16/64]
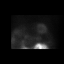
[frame 27/64]
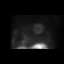
[frame 38/64]
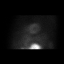
[frame 48/64]
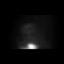
[frame 59/64]
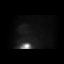

[Series 1: rest sax · 6.4mm · 6.40mm/px · 6 of 23 frames shown]
[frame 2/23]
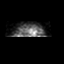
[frame 6/23]
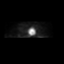
[frame 10/23]
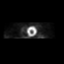
[frame 14/23]
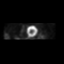
[frame 18/23]
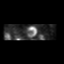
[frame 22/23]
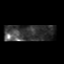

[Series 2: stress sax gs · 6.4mm · 6.40mm/px · 6 of 184 frames shown]
[frame 16/184]
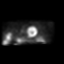
[frame 46/184]
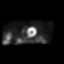
[frame 77/184]
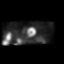
[frame 108/184]
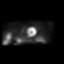
[frame 138/184]
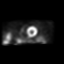
[frame 169/184]
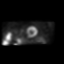

[Series 2: wbr stress-gsp · 6.40mm/px · 6 of 512 frames shown]
[frame 43/512]
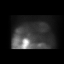
[frame 128/512]
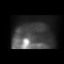
[frame 214/512]
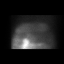
[frame 299/512]
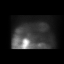
[frame 384/512]
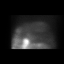
[frame 470/512]
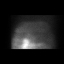

[Series 3: stress sax · 6.4mm · 6.40mm/px · 6 of 23 frames shown]
[frame 2/23]
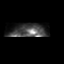
[frame 6/23]
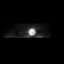
[frame 10/23]
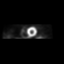
[frame 14/23]
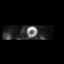
[frame 18/23]
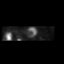
[frame 22/23]
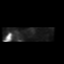

[Series 3: wbr stress-sum-em · 6.40mm/px · 6 of 64 frames shown]
[frame 6/64]
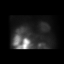
[frame 16/64]
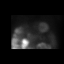
[frame 27/64]
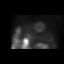
[frame 38/64]
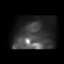
[frame 48/64]
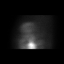
[frame 59/64]
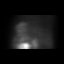

[36 of 36 positions shown; findings below may reference images not displayed]

Canned report from images found in remote index.

Refer to host system for actual result text.

## 2020-09-27 IMAGING — DX PORTABLE CHEST - 1 VIEW
1 series · 1 of 1 positions shown · non-contrast
Comparison: Film from earlier in the same day.

CLINICAL DATA: Shortness of breath

EXAM:
PORTABLE CHEST 1 VIEW

[chest]
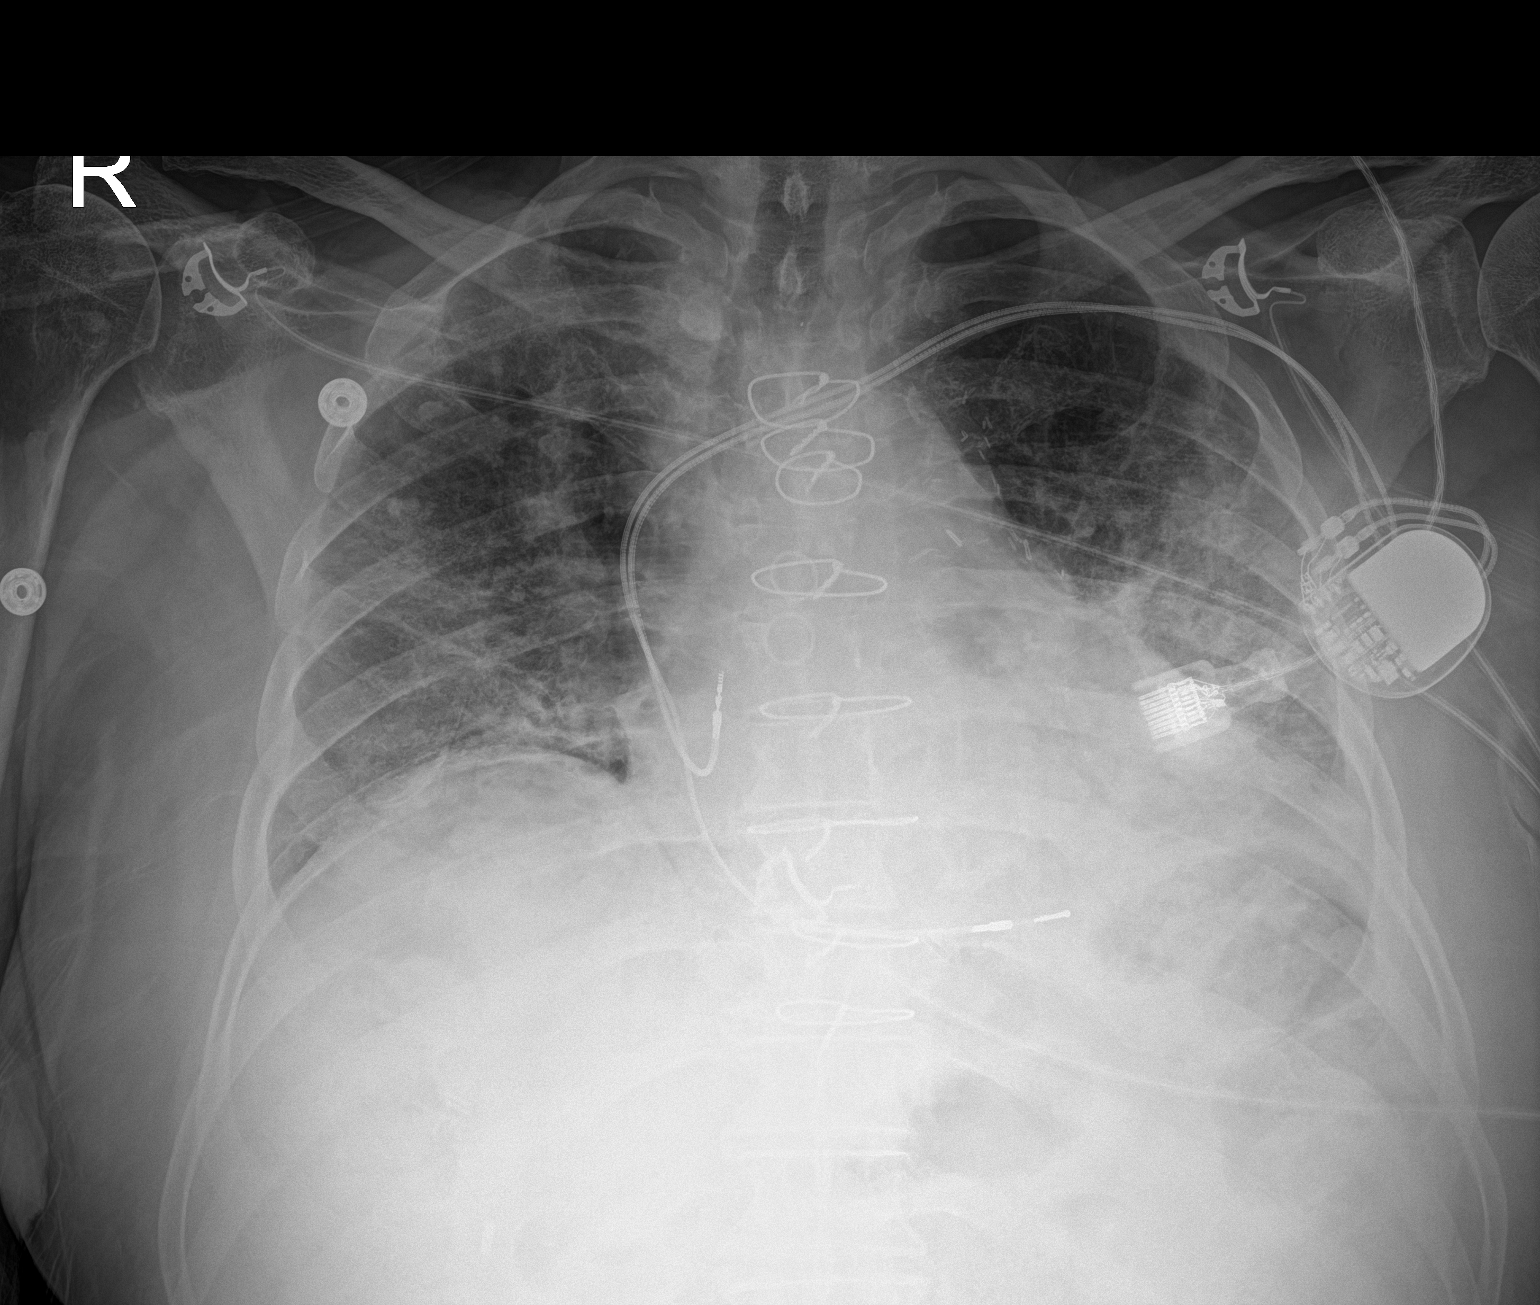

[1 of 1 positions shown; findings below may reference images not displayed]

FINDINGS: Cardiac shadow is stable. Pacing device is again seen. Previously
seen infiltrates in both lungs are again identified but less
confluent likely related to in improved inspiratory effort.
Postsurgical changes are noted.
IMPRESSION: Persistent but mildly improved infiltrates bilaterally.
